# Patient Record
Sex: Male | Born: 2000 | Race: Black or African American | Hispanic: No | Marital: Single | Smoking: Never smoker
Health system: Southern US, Community
[De-identification: ages and names within clinical notes are randomized; demographics above are authoritative.]

---

## 2021-02-13 ENCOUNTER — Other Ambulatory Visit: Payer: Self-pay

## 2021-02-13 ENCOUNTER — Encounter: Payer: Self-pay | Admitting: Nurse Practitioner

## 2021-02-19 ENCOUNTER — Ambulatory Visit (INDEPENDENT_AMBULATORY_CARE_PROVIDER_SITE_OTHER): Payer: Medicaid Other | Admitting: Nurse Practitioner

## 2021-02-19 ENCOUNTER — Other Ambulatory Visit: Payer: Self-pay

## 2021-02-19 ENCOUNTER — Ambulatory Visit (HOSPITAL_COMMUNITY)
Admission: RE | Admit: 2021-02-19 | Discharge: 2021-02-19 | Disposition: A | Discharge status: Home / Self Care | Payer: Medicaid Other | Source: Ambulatory Visit | Attending: Nurse Practitioner | Admitting: Nurse Practitioner

## 2021-02-19 ENCOUNTER — Encounter: Payer: Self-pay | Admitting: Nurse Practitioner

## 2021-02-19 VITALS — BP 129/69 | HR 71 | Temp 97.8°F | Ht 74.0 in | Wt 130.0 lb

## 2021-02-19 DIAGNOSIS — M25562 Pain in left knee: Secondary | ICD-10-CM | POA: Diagnosis not present

## 2021-02-19 DIAGNOSIS — Z23 Encounter for immunization: Secondary | ICD-10-CM | POA: Diagnosis not present

## 2021-02-19 DIAGNOSIS — Z0000 Encounter for general adult medical examination without abnormal findings: Secondary | ICD-10-CM

## 2021-02-19 DIAGNOSIS — G8929 Other chronic pain: Secondary | ICD-10-CM | POA: Insufficient documentation

## 2021-02-19 DIAGNOSIS — M25561 Pain in right knee: Secondary | ICD-10-CM | POA: Diagnosis not present

## 2021-02-19 LAB — POCT URINALYSIS DIP (CLINITEK)
Bilirubin, UA: NEGATIVE
Blood, UA: NEGATIVE
Glucose, UA: NEGATIVE mg/dL
Ketones, POC UA: NEGATIVE mg/dL
Leukocytes, UA: NEGATIVE
Nitrite, UA: NEGATIVE
POC PROTEIN,UA: NEGATIVE
Spec Grav, UA: 1.025 (ref 1.010–1.025)
Urobilinogen, UA: 0.2 E.U./dL
pH, UA: 6 (ref 5.0–8.0)

## 2021-02-19 NOTE — Patient Instructions (Addendum)
You were seen today in the [facility] to establish care. Labs were collected, results will be available via MyChart or, if abnormal, you will be contacted by clinic staff. You were prescribed medications, please take as directed. Please follow up in 3 mths for reevaluation. Referral was sent to orthopedics, please call them to schedule appointment.  Please take OTC tylenol and/ ibuprofen at home as needed for pain.

## 2021-02-19 NOTE — Progress Notes (Signed)
[place], [place]  [id number] Phone:  [phone]   Fax:  [phone] Subjective:   Patient ID: Taylor Woods, male    DOB: [birth year], 20 y.o.   MRN: [id number]  Chief Complaint  Patient presents with   Establish Care    Bilateral leg pain; started 2019 does not take anything for relief.   HPI Taylor Woods 20 y.o. male presents to establish care. Patient a refugee from [place] who has been in the [place] x 1 mth. States that he would like evaluation and treatment of bilateral knee pain. Has had pain since 2019. Denies any trauma or injury. Rates pain 9/10 and describes as throbbing, occurs throughout the day worsening with ambulation. Has not taken any medications for symptoms. Denies any numbness or tingling. Brother at bedside states that he took patient to specialist in [place] and was informed that he would need surgical repair of bilateral knees. Denies any chest pain or shortness of breath. Denies any HA, dizziness or blurred vision. Denies any numbness or tingling. Patient not currently working since arrival in the [place].  No past medical history on file.   No family history on file.  Social History   Socioeconomic History   Marital status: Single    Spouse name: Not on file   Number of children: Not on file   Years of education: Not on file   Highest education level: Not on file  Occupational History   Not on file  Tobacco Use   Smoking status: Never   Smokeless tobacco: Never  Substance and Sexual Activity   Alcohol use: Never   Drug use: Never   Sexual activity: Not Currently  Other Topics Concern   Not on file  Social History Narrative   Not on file   Social Determinants of Health   Financial Resource Strain: Not on file  Food Insecurity: Not on file  Transportation Needs: Not on file  Physical Activity: Not on file  Stress: Not on file  Social Connections: Not on file  Intimate Partner Violence: Not on file     No outpatient medications prior to visit.   No facility-administered medications prior to visit.    No Known Allergies  Review of Systems  Constitutional:  Negative for chills, fever and malaise/fatigue.  Respiratory:  Negative for cough and shortness of breath.   Cardiovascular:  Negative for chest pain, palpitations and leg swelling.  Gastrointestinal:  Negative for abdominal pain, blood in stool, constipation, diarrhea, nausea and vomiting.  Musculoskeletal:  Positive for joint pain.       Pain in bilateral knees  Skin: Negative.   Psychiatric/Behavioral:  Negative for depression. The patient is not nervous/anxious.   All other systems reviewed and are negative.     Objective:    Physical Exam Vitals reviewed.  Constitutional:      General: He is not in acute distress.    Appearance: Normal appearance.  HENT:     Head: Normocephalic.     Mouth/Throat:     Mouth: Mucous membranes are moist.  Eyes:     Extraocular Movements: Extraocular movements intact.     Conjunctiva/sclera: Conjunctivae normal.     Pupils: Pupils are equal, round, and reactive to light.  Cardiovascular:     Rate and Rhythm: Normal rate and regular rhythm.     Pulses: Normal pulses.     Heart sounds: Normal heart sounds.     Comments: No obvious  peripheral edema Pulmonary:     Effort: Pulmonary effort is normal.     Breath sounds: Normal breath sounds.  Abdominal:     General: Abdomen is flat. Bowel sounds are normal.     Palpations: Abdomen is soft.  Musculoskeletal:        General: No swelling, tenderness, deformity or signs of injury. Normal range of motion.     Cervical back: Normal range of motion and neck supple.     Right lower leg: No edema.     Left lower leg: No edema.  Skin:    General: Skin is warm and dry.     Capillary Refill: Capillary refill takes less than 2 seconds.  Neurological:     General: No focal deficit present.     Mental Status: He is alert and oriented to  person, place, and time. Mental status is at baseline.  Psychiatric:        Mood and Affect: Mood normal.        Behavior: Behavior normal.        Thought Content: Thought content normal.        Judgment: Judgment normal.    BP 129/69   Pulse 71   Temp 97.8 F (36.6 C)   Ht _0  (1.88 m)   Wt 130 lb (59 kg)   SpO2 98%   BMI 16.69 kg/m  Wt Readings from Last 3 Encounters:  02/19/21 130 lb (59 kg)    Immunization History  Administered Date(s) Administered   Influenza,inj,Quad PF,6+ Mos 02/19/2021    Diabetic Foot Exam - Simple   No data filed     No results found for: TSH No results found for: WBC, HGB, HCT, MCV, PLT No results found for: NA, K, CHLORIDE, CO2, GLUCOSE, BUN, CREATININE, BILITOT, ALKPHOS, AST, ALT, PROT, ALBUMIN, CALCIUM, ANIONGAP, EGFR, GFR No results found for: CHOL No results found for: HDL No results found for: LDLCALC No results found for: TRIG No results found for: CHOLHDL No results found for: HGBA1C     Assessment & Plan:   Problem List Items Addressed This Visit   None Visit Diagnoses     Healthcare maintenance    -  Primary   Relevant Orders   POCT URINALYSIS DIP (CLINITEK) (Completed)   CBC with Differential/Platelet   Comprehensive metabolic panel   Lipid panel   Flu Vaccine QUAD 67moIM (Fluarix, Fluzone & Alfiuria Quad PF) (Completed)   Hemoglobin A1c   Chronic pain of both knees       Relevant Orders   Ambulatory referral to Orthopedic Surgery   DG Knee Complete 4 Views Left   DG Knee Complete 4 Views Right Patient informed to take OTC tylenol and/ ibuprofen as needed for pain       Taylor Woods does not currently have medications on file.  No orders of the defined types were placed in this encounter.    TTeena Dunk NP

## 2021-02-20 ENCOUNTER — Other Ambulatory Visit: Payer: Self-pay | Admitting: Nurse Practitioner

## 2021-02-20 ENCOUNTER — Telehealth: Payer: Self-pay

## 2021-02-20 DIAGNOSIS — R748 Abnormal levels of other serum enzymes: Secondary | ICD-10-CM

## 2021-02-20 LAB — CBC WITH DIFFERENTIAL/PLATELET
Basophils Absolute: 0.1 10*3/uL (ref 0.0–0.2)
Basos: 1 %
EOS (ABSOLUTE): 0.5 10*3/uL — ABNORMAL HIGH (ref 0.0–0.4)
Eos: 12 %
Hematocrit: 45 % (ref 37.5–51.0)
Hemoglobin: 14.6 g/dL (ref 13.0–17.7)
Immature Grans (Abs): 0 10*3/uL (ref 0.0–0.1)
Immature Granulocytes: 0 %
Lymphocytes Absolute: 2.4 10*3/uL (ref 0.7–3.1)
Lymphs: 50 %
MCH: 26.6 pg (ref 26.6–33.0)
MCHC: 32.4 g/dL (ref 31.5–35.7)
MCV: 82 fL (ref 79–97)
Monocytes Absolute: 0.5 10*3/uL (ref 0.1–0.9)
Monocytes: 10 %
Neutrophils Absolute: 1.2 10*3/uL — ABNORMAL LOW (ref 1.4–7.0)
Neutrophils: 27 %
Platelets: 243 10*3/uL (ref 150–450)
RBC: 5.49 x10E6/uL (ref 4.14–5.80)
RDW: 13.9 % (ref 11.6–15.4)
WBC: 4.6 10*3/uL (ref 3.4–10.8)

## 2021-02-20 LAB — COMPREHENSIVE METABOLIC PANEL
ALT: 25 IU/L (ref 0–44)
AST: 25 IU/L (ref 0–40)
Albumin/Globulin Ratio: 1.8 (ref 1.2–2.2)
Albumin: 4.6 g/dL (ref 4.1–5.2)
Alkaline Phosphatase: 402 IU/L — ABNORMAL HIGH (ref 51–125)
BUN/Creatinine Ratio: 13 (ref 9–20)
BUN: 8 mg/dL (ref 6–20)
Bilirubin Total: 0.2 mg/dL (ref 0.0–1.2)
CO2: 26 mmol/L (ref 20–29)
Calcium: 9.8 mg/dL (ref 8.7–10.2)
Chloride: 101 mmol/L (ref 96–106)
Creatinine, Ser: 0.6 mg/dL — ABNORMAL LOW (ref 0.76–1.27)
Globulin, Total: 2.6 g/dL (ref 1.5–4.5)
Glucose: 95 mg/dL (ref 65–99)
Potassium: 4.9 mmol/L (ref 3.5–5.2)
Sodium: 139 mmol/L (ref 134–144)
Total Protein: 7.2 g/dL (ref 6.0–8.5)
eGFR: 142 mL/min/{1.73_m2} (ref >59)

## 2021-02-20 LAB — LIPID PANEL
Chol/HDL Ratio: 2.1 ratio (ref 0.0–5.0)
Cholesterol, Total: 139 mg/dL (ref 100–199)
HDL: 66 mg/dL (ref >39)
LDL Chol Calc (NIH): 62 mg/dL (ref 0–99)
Triglycerides: 50 mg/dL (ref 0–149)
VLDL Cholesterol Cal: 11 mg/dL (ref 5–40)

## 2021-02-20 LAB — HEMOGLOBIN A1C
Est. average glucose Bld gHb Est-mCnc: 117 mg/dL
Hgb A1c MFr Bld: 5.7 % — ABNORMAL HIGH (ref 4.8–5.6)

## 2021-02-20 NOTE — Telephone Encounter (Signed)
Left voicemail to call back for lab appointment.  

## 2021-03-01 ENCOUNTER — Ambulatory Visit (INDEPENDENT_AMBULATORY_CARE_PROVIDER_SITE_OTHER): Payer: Medicaid Other | Admitting: Orthopedic Surgery

## 2021-03-01 ENCOUNTER — Other Ambulatory Visit: Payer: Self-pay

## 2021-03-01 DIAGNOSIS — M25562 Pain in left knee: Secondary | ICD-10-CM | POA: Diagnosis not present

## 2021-03-01 NOTE — Progress Notes (Signed)
Office Visit Note   Patient: Taylor Woods           Date of Birth: [birth year]           MRN: [id number] Visit Date: 03/01/2021              Requested by: [name], NP [address] [place],  [place] [address] PCP: Kathrynn Speed, NP  Chief Complaint  Patient presents with   Left Knee - Pain   Right Knee - Pain      HPI: Patient is a 20 year old gentleman who is seen for initial evaluation with his father.  Patient's father notes that the patient is having progressive valgus deformity.  Patient's father states that in [place] it was recommended to perform a bone type procedure for both knees.  Assessment & Plan: Visit Diagnoses:  1. Left knee pain, unspecified chronicity     Plan: Patient's ligaments are stable there is no abnormality to the growth plate.  Will start patient with physical therapy for VMO quad strengthening.  We will obtain a vitamin D3 level CBC and a complete metabolic profile.  Patient's growth plates are essentially closed.  Follow-Up Instructions: Return in about 2 months (around 05/01/2021).   Ortho Exam  Patient is alert, oriented, no adenopathy, well-dressed, normal affect, normal respiratory effort. On examination patient has slight valgus to the right knee greater than the left.  Patient's father also has valgus deformity to both knees.  Patient has extremely atrophic muscles and both the thigh and calf bilaterally.  Patient's patella tracks midline.  Collateral and cruciate ligaments are stable no ligamentous instability.  Review of his radiographs shows congruent growth plates with congruent joint space without any growth plate abnormality.  The growth plates are almost completely closed.    Of note his hemoglobin A1c about 10 days ago was 5.7.  Imaging: No results found. No images are attached to the encounter.  Labs: Lab Results  Component Value Date   HGBA1C 5.7 (H) 02/19/2021     Lab Results  Component Value Date   ALBUMIN  4.6 02/19/2021    No results found for: MG No results found for: VD25OH  No results found for: PREALBUMIN CBC EXTENDED Latest Ref Rng & Units 02/19/2021  WBC 3.4 - 10.8 x10E3/uL 4.6  RBC 4.14 - 5.80 x10E6/uL 5.49  HGB 13.0 - 17.7 g/dL 54.6  HCT 27.0 - 35.0 % 45.0  PLT 150 - 450 x10E3/uL 243  NEUTROABS 1.4 - 7.0 x10E3/uL 1.2(L)  LYMPHSABS 0.7 - 3.1 x10E3/uL 2.4     There is no height or weight on file to calculate BMI.  Orders:  Orders Placed This Encounter  Procedures   Vitamin D (25 hydroxy)   CBC with Differential   Comprehensive Metabolic Panel (CMET)   Ambulatory referral to Physical Therapy   No orders of the defined types were placed in this encounter.    Procedures: No procedures performed  Clinical Data: No additional findings.  ROS:  All other systems negative, except as noted in the HPI. Review of Systems  Objective: Vital Signs: There were no vitals taken for this visit.  Specialty Comments:  No specialty comments available.  PMFS History: There are no problems to display for this patient.  No past medical history on file.  No family history on file.   Social History   Occupational History   Not on file  Tobacco Use   Smoking status: Never   Smokeless tobacco:  Never  Substance and Sexual Activity   Alcohol use: Never   Drug use: Never   Sexual activity: Not Currently

## 2021-03-02 LAB — CBC WITH DIFFERENTIAL/PLATELET
Absolute Monocytes: 390 cells/uL (ref 200–950)
Basophils Absolute: 39 cells/uL (ref 0–200)
Basophils Relative: 1 %
Eosinophils Absolute: 300 cells/uL (ref 15–500)
Eosinophils Relative: 7.7 %
HCT: 45.4 % (ref 38.5–50.0)
Hemoglobin: 14.8 g/dL (ref 13.2–17.1)
Lymphs Abs: 2005 cells/uL (ref 850–3900)
MCH: 26.8 pg — ABNORMAL LOW (ref 27.0–33.0)
MCHC: 32.6 g/dL (ref 32.0–36.0)
MCV: 82.1 fL (ref 80.0–100.0)
MPV: 10.4 fL (ref 7.5–12.5)
Monocytes Relative: 10 %
Neutro Abs: 1166 cells/uL — ABNORMAL LOW (ref 1500–7800)
Neutrophils Relative %: 29.9 %
Platelets: 238 10*3/uL (ref 140–400)
RBC: 5.53 10*6/uL (ref 4.20–5.80)
RDW: 13.7 % (ref 11.0–15.0)
Total Lymphocyte: 51.4 %
WBC: 3.9 10*3/uL (ref 3.8–10.8)

## 2021-03-02 LAB — COMPREHENSIVE METABOLIC PANEL
AG Ratio: 1.8 (calc) (ref 1.0–2.5)
ALT: 22 U/L (ref 9–46)
AST: 19 U/L (ref 10–40)
Albumin: 4.6 g/dL (ref 3.6–5.1)
Alkaline phosphatase (APISO): 359 U/L — ABNORMAL HIGH (ref 36–130)
BUN/Creatinine Ratio: 15 (calc) (ref 6–22)
BUN: 8 mg/dL (ref 7–25)
CO2: 28 mmol/L (ref 20–32)
Calcium: 9.8 mg/dL (ref 8.6–10.3)
Chloride: 102 mmol/L (ref 98–110)
Creat: 0.55 mg/dL — ABNORMAL LOW (ref 0.60–1.24)
Globulin: 2.6 g/dL (calc) (ref 1.9–3.7)
Glucose, Bld: 108 mg/dL — ABNORMAL HIGH (ref 65–99)
Potassium: 4.7 mmol/L (ref 3.5–5.3)
Sodium: 139 mmol/L (ref 135–146)
Total Bilirubin: 0.4 mg/dL (ref 0.2–1.2)
Total Protein: 7.2 g/dL (ref 6.1–8.1)

## 2021-03-02 LAB — VITAMIN D 25 HYDROXY (VIT D DEFICIENCY, FRACTURES): Vit D, 25-Hydroxy: 24 ng/mL — ABNORMAL LOW (ref 30–100)

## 2021-04-30 ENCOUNTER — Encounter: Payer: Self-pay | Admitting: Orthopedic Surgery

## 2021-04-30 ENCOUNTER — Ambulatory Visit (INDEPENDENT_AMBULATORY_CARE_PROVIDER_SITE_OTHER): Payer: Medicaid Other | Admitting: Orthopedic Surgery

## 2021-04-30 DIAGNOSIS — M898X9 Other specified disorders of bone, unspecified site: Secondary | ICD-10-CM | POA: Diagnosis not present

## 2021-04-30 DIAGNOSIS — M25561 Pain in right knee: Secondary | ICD-10-CM

## 2021-04-30 NOTE — Progress Notes (Signed)
Office Visit Note   Patient: Taylor Woods           Date of Birth: 12-Jun-2000           MRN: [id number] Visit Date: 04/30/2021              Requested by: [name], NP [address], [place],  [place] [address] PCP: [name], NP  Chief Complaint  Patient presents with   Left Knee - Pain, Follow-up      HPI: Patient is a 20 year old gentleman who presents in follow-up with knee pain at this time primarily in the right knee over the proximal tibia metaphysis.  Family accompanying patient states that therapy did not set up therapy for VMO strengthening.  Assessment & Plan: Visit Diagnoses:  1. Acute pain of right knee   2. Bone disease, metabolic     Plan: Will place a new order for physical therapy for VMO strengthening both lower extremities.  I reviewed his laboratory values and recommended vitamin D3 supplement 5000 international units a day and vitamin C 2000 mg a day.  Follow-Up Instructions: Return in about 2 months (around 06/30/2021).   Ortho Exam  Patient is alert, oriented, no adenopathy, well-dressed, normal affect, normal respiratory effort. Examination patient has an antalgic gait with valgus alignment to both knees.  Examination the right knee his primarily tender to palpation over the metaphysis of the right knee.  The medial and lateral joint lines are nontender to palpation collaterals are cruciates are stable.  In review of the right knee there is no metaphyseal defects in the bone no sclerotic changes the growth plates are open.  Patient's alkaline phosphatase is 359 consistent with a underlying bone disease.  His vitamin D level is low at 24.  He has mild microcytic anemia with a MCV of 26.8.  Imaging: No results found. No images are attached to the encounter.  Labs: Lab Results  Component Value Date   HGBA1C 5.7 (H) 02/19/2021     Lab Results  Component Value Date   ALBUMIN 4.6 02/19/2021    No results found for: MG Lab  Results  Component Value Date   VD25OH 24 (L) 03/01/2021    No results found for: PREALBUMIN CBC EXTENDED Latest Ref Rng & Units 03/01/2021 02/19/2021  WBC 3.8 - 10.8 Thousand/uL 3.9 4.6  RBC 4.20 - 5.80 Million/uL 5.53 5.49  HGB 13.2 - 17.1 g/dL 40.8 14.4  HCT 81.8 - 56.3 % 45.4 45.0  PLT 140 - 400 Thousand/uL 238 243  NEUTROABS 1,500 - 7,800 cells/uL 1,166(L) 1.2(L)  LYMPHSABS 850 - 3,900 cells/uL 2,005 2.4     There is no height or weight on file to calculate BMI.  Orders:  No orders of the defined types were placed in this encounter.  No orders of the defined types were placed in this encounter.    Procedures: No procedures performed  Clinical Data: No additional findings.  ROS:  All other systems negative, except as noted in the HPI. Review of Systems  Objective: Vital Signs: There were no vitals taken for this visit.  Specialty Comments:  No specialty comments available.  PMFS History: There are no problems to display for this patient.  History reviewed. No pertinent past medical history.  History reviewed. No pertinent family history.  History reviewed. No pertinent surgical history. Social History   Occupational History   Not on file  Tobacco Use   Smoking status: Never   Smokeless  tobacco: Never  Substance and Sexual Activity   Alcohol use: Never   Drug use: Never   Sexual activity: Not Currently

## 2021-05-19 ENCOUNTER — Other Ambulatory Visit: Payer: Self-pay

## 2021-05-19 ENCOUNTER — Ambulatory Visit: Payer: Medicaid Other | Attending: Orthopedic Surgery | Admitting: Physical Therapy

## 2021-05-19 ENCOUNTER — Encounter: Payer: Self-pay | Admitting: Physical Therapy

## 2021-05-19 DIAGNOSIS — M6281 Muscle weakness (generalized): Secondary | ICD-10-CM | POA: Insufficient documentation

## 2021-05-19 DIAGNOSIS — R2689 Other abnormalities of gait and mobility: Secondary | ICD-10-CM | POA: Diagnosis present

## 2021-05-19 DIAGNOSIS — M898X9 Other specified disorders of bone, unspecified site: Secondary | ICD-10-CM | POA: Diagnosis not present

## 2021-05-19 DIAGNOSIS — M25562 Pain in left knee: Secondary | ICD-10-CM | POA: Diagnosis not present

## 2021-05-19 DIAGNOSIS — G8929 Other chronic pain: Secondary | ICD-10-CM | POA: Diagnosis present

## 2021-05-19 DIAGNOSIS — M25561 Pain in right knee: Secondary | ICD-10-CM | POA: Insufficient documentation

## 2021-05-19 NOTE — Therapy (Signed)
OUTPATIENT PHYSICAL THERAPY LOWER EXTREMITY EVALUATION   Patient Name: Taylor Woods MRN: [id number] DOB:January 18, 2001, 20 y.o., male Today's Date: 05/19/2021   PT End of Session - 05/19/21 1213     Visit Number 1    Date for PT Re-Evaluation 07/14/21    Authorization Type [employer]    PT Start Time 1115    PT Stop Time 1200    PT Time Calculation (min) 45 min             History reviewed. No pertinent past medical history. History reviewed. No pertinent surgical history. There are no problems to display for this patient.   PCP: Kathrynn Speed, NP  REFERRING PROVIDER: Nadara Mustard, MD  REFERRING DIAG: Referral diagnosis: Acute pain of right knee [M25.561], Bone disease, metabolic [M89.8X9]  THERAPY DIAG:  Chronic pain of left knee  Chronic pain of right knee  Muscle weakness  Other abnormalities of gait and mobility  ONSET DATE: 2019  SUBJECTIVE:                                                                                                                                                                                           Taylor Woods is a 20 y.o. male who presents to clinic with chief complaint of bil knee pain.  MOI/History of condition:  About 3 years.  Insidious onset, he is unsure why.  MD reports there may be some underlying bone disease.  From referring provider:   "Patient is a 20 year old gentleman who presents in follow-up with knee pain at this time primarily in the right knee over the proximal tibia metaphysis.  Family accompanying patient states that therapy did not set up therapy for VMO strengthening.  Patient is alert, oriented, no adenopathy, well-dressed, normal affect, normal respiratory effort.  Examination patient has an antalgic gait with valgus alignment to both knees.  Examination the right knee his primarily tender to palpation over the metaphysis of the right knee.  The medial and lateral joint lines are nontender to palpation  collaterals are cruciates are stable.  In review of the right knee there is no metaphyseal defects in the bone no sclerotic changes the growth plates are open.  Patient's alkaline phosphatase is 359 consistent with a underlying bone disease.  His vitamin D level is low at 24.  He has mild microcytic anemia with a MCV of 26.8."   Red flags:  denies   Pertinent past history:  none  Pain:  Are you having pain? 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 No, Number of falls: 0  LIVING ENVIRONMENT: Lives with: lives with their family Stairs: No  PLOF: Independent  DIAGNOSTIC FINDINGS:   FINDINGS: Right knee:   The patient is skeletally immature. There is no definite acute fracture or dislocation. Joint spaces and growth plates appear well maintained. Soft tissues are within normal limits. Punctate radiopaque density is favored is artifact at the level of the distal femur.   Left knee:   The patient is skeletally immature. There is no definite acute fracture or dislocation. Joint spaces and growth plates appear well maintained. Soft tissues are within normal limits. Punctate radiopaque density is favored is artifact at the level of the distal femur.   OBJECTIVE:    GENERAL OBSERVATION:   Some mild edema anterior knee R>L, hypermobile patella R>L, mod high arch with no collapse  SENSATION:  Light touch: Appears intact  MUSCLE LENGTH: Hamstrings: WNL Thomas  test: WNL Ely's test: WNL  LE ROM:  ROM Right 05/19/2021 Left 05/19/2021  Hip flexion WNL WNL  Hip extension WNL WNL  Hip abduction WNL WNL  Hip adduction    Hip internal rotation WNL WNL  Hip external rotation WNL WNL  Knee flexion WNL WNL  Knee extension WNL WNL  Ankle dorsiflexion    Ankle plantarflexion    Ankle inversion    Ankle eversion     (Blank rows = not tested)  LE MMT:  MMT Right 05/19/2021 Left 05/19/2021  Hip flexion 4 4  Knee extension 4 P! 4 P!  Knee flexion 3 P! 4 P!  Hip abduction 4 3+  Hip extension 3+ 3+  Hip external rotation    Hip internal rotation 3+ P! 3+ P!  Hip adduction    Ankle dorsiflexion    Ankle plantarflexion    Ankle inversion    Ankle eversion     (Blank rows = not tested; all scores listed out of a possible 5)   LOWER EXTREMITY SPECIAL TESTS:  Knee special tests: ligamentous testing (-)  PALPATION: Tenderness over pes anserine bil  FUNCTIONAL TESTS:  NA  GAIT: Comments: significant bilateral valgus R>L, with R>L internal rotation   PATIENT EDUCATION:  POC, diagnosis, prognosis, HEP, and outcome measures.  Pt educated via explanation, demonstration, and handout (HEP).  Pt confirms understanding verbally.    HOME EXERCISE PROGRAM: Access Code: [email or web address] URL: [email or web address] Date: 05/19/2021 Prepared by: Alphonzo Severance  Exercises Supine Bridge - 1 x daily - 7 x weekly - 3 sets - 10 reps Clamshell with Resistance - 1 x daily - 7 x weekly - 3 sets - 10 reps Seated Hamstring Curl with Anchored Resistance - 1 x daily - 7 x weekly - 3 sets - 10 reps   ASSESSMENT:  CLINICAL IMPRESSION: Taylor Woods is a 20 y.o. male who presents to clinic with signs and sxs consistent with bil knee pain.  He has pain over bil pes anserine with palpation and pain with resisted hip flexion + IR as well as HS testing.  This may be tendinitis/bursitis, or could be related to an underlying bone pathology per MD.  He does  have  significant valgus deformity bil R>L with hip and knee weakness that should be addressed.  Arches are not collapsed.  We did talk about accomodation at work as he is in a tough situation with having to be on his feet all day.  We discussed that he would have to go through Dr. Lajoyce Corners to be put on light duty; they confirm understanding.  Patient presents with pain and impairments/deficits in: knee and hip strength.  Activity limitations include: walking, standing, squatting.  Participation limitations include: working without pain.  Patient will benefit from skilled therapy to address pain and the listed deficits in order to achieve functional goals, enable safety and independence in completion of daily tasks, and return to PLOF.   REHAB POTENTIAL: Good  CLINICAL DECISION MAKING: Stable/uncomplicated  EVALUATION COMPLEXITY: Low   GOALS: Goals reviewed with patient? Yes  SHORT TERM GOALS:  STG Name Target Date Goal status  1 [place] will be >75% HEP compliant to improve carryover between sessions and facilitate independent management of condition  Baseline: No HEP 06/09/2021 INITIAL   LONG TERM GOALS:   LTG Name Target Date Goal status  1 Mieczyslaw will report >/= 50% decrease in pain from evaluation   Baseline: 9/10 max pain 07/14/2021 INITIAL  2 Chevy will improve the following MMTs to >/= 4/5 to show improvement in strength:  knee and hip abduction    Baseline: see chart in note 07/14/2021 INITIAL  3 [place]ally will be able to complete work, not limited by pain  Baseline: pain as high as 9/10 07/14/2021 INITIAL   PLAN: PT FREQUENCY: 1-2x/week  PT DURATION: 8 weeks (Ending 07/14/2021)  PLANNED INTERVENTIONS: Therapeutic exercises, Therapeutic activity, Neuro Muscular re-education, Gait training, Patient/Family education, Joint mobilization, Dry Needling, Electrical stimulation, Spinal mobilization and/or manipulation, Moist heat, Taping, Vasopneumatic device, Ionotophoresis 4mg /ml Dexamethasone,  and Manual therapy  PLAN FOR NEXT SESSION: progress knee and hip strength, see if gradual loading of pes anserine is effective, activity modification at work    PT, DPT 05/19/2021, 12:14 PM

## 2021-05-21 ENCOUNTER — Ambulatory Visit: Payer: Self-pay | Admitting: Nurse Practitioner

## 2021-05-21 NOTE — Progress Notes (Signed)
This encounter was created in error - please disregard.

## 2021-06-09 ENCOUNTER — Other Ambulatory Visit: Payer: Self-pay

## 2021-06-09 ENCOUNTER — Ambulatory Visit: Payer: Medicaid Other | Attending: Orthopedic Surgery | Admitting: Physical Therapy

## 2021-06-09 DIAGNOSIS — M25561 Pain in right knee: Secondary | ICD-10-CM | POA: Insufficient documentation

## 2021-06-09 DIAGNOSIS — G8929 Other chronic pain: Secondary | ICD-10-CM | POA: Diagnosis present

## 2021-06-09 DIAGNOSIS — R2689 Other abnormalities of gait and mobility: Secondary | ICD-10-CM | POA: Insufficient documentation

## 2021-06-09 DIAGNOSIS — M25562 Pain in left knee: Secondary | ICD-10-CM | POA: Insufficient documentation

## 2021-06-09 DIAGNOSIS — M6281 Muscle weakness (generalized): Secondary | ICD-10-CM | POA: Diagnosis present

## 2021-06-09 NOTE — Therapy (Signed)
OUTPATIENT PHYSICAL THERAPY TREATMENT NOTE   Patient Name: Taylor Woods MRN: [id number] DOB:2000/07/10, 21 y.o., male 77 Date: 06/09/2021  PCP: Kathrynn Speed, NP REFERRING PROVIDER: Nadara Mustard, MD   PT End of Session - 06/09/21 1115     Visit Number 2    Date for PT Re-Evaluation 07/14/21    Authorization Type [employer]    PT Start Time 1115    PT Stop Time 1157    PT Time Calculation (min) 42 min             No past medical history on file. No past surgical history on file. There are no problems to display for this patient.   REFERRING DIAG: knee pain  THERAPY DIAG:  Chronic pain of left knee  Chronic pain of right knee  Muscle weakness  Other abnormalities of gait and mobility  PERTINENT HISTORY: none  PRECAUTIONS/RESTRICTIONS:   none  SUBJECTIVE:  Pt reports there has been little change in his knee pain since last visit  Are you having pain? Yes Pain location: pes anserine area bil NPRS scale: 8-9/10 Aggravating factors: walking (hours), jumping, bending Relieving factors: rest, ice Pain description: sharp and aching Severity: high Irritability: moderate Stage: Chronic Stability: staying the same 24 hour pattern: no clear patter   OBJECTIVE:  LE ROM:   ROM Right 05/19/2021 Left 05/19/2021  Hip flexion WNL WNL  Hip extension WNL WNL  Hip abduction WNL WNL  Hip adduction      Hip internal rotation WNL WNL  Hip external rotation WNL WNL  Knee flexion WNL WNL  Knee extension WNL WNL  Ankle dorsiflexion      Ankle plantarflexion      Ankle inversion      Ankle eversion       (Blank rows = not tested)   LE MMT:   MMT Right 05/19/2021 Left 05/19/2021  Hip flexion 4 4  Knee extension 4 P! 4 P!  Knee flexion 3 P! 4 P!  Hip abduction 4 3+  Hip extension 3+ 3+  Hip external rotation      Hip internal rotation 3+ P! 3+ P!  Hip adduction      Ankle dorsiflexion      Ankle plantarflexion      Ankle inversion       Ankle eversion       (Blank rows = not tested; all scores listed out of a possible 5)   TREATMENT 06/09/2021:  Therapeutic Exercise: - bike for warm up - quad set with medial patellar glide glide 2x20 ea - S/L bridge - 3x10 ea - S/L hip abd - 3x10 ea (heavy cues) - knee ext machine - 3x10 @ 20 (S/L - leg press d/c d/t pain - side plank with clam - 3x10 ea with RTB   Patient Education: - HEP was updated and reissued to patient; pt educated on HEP, was provided handout, and verbally confirmed understanding of exercises.   HOME EXERCISE PROGRAM: Access Code: [email or web address] URL: [email or web address] Date: 06/09/2021 Prepared by: Alphonzo Severance  Exercises Seated Hamstring Curl with Anchored Resistance - 1 x daily - 7 x weekly - 3 sets - 10 reps Seated Knee Extension with Resistance - 1 x daily - 7 x weekly - 3 sets - 10 reps Single Leg Bridge - 1 x daily - 7 x weekly - 3 sets - 10 reps Side Plank with Clam and Resistance - 1 x daily - 7 x weekly - 3 sets -  10 reps    ASSESSMENT:   CLINICAL IMPRESSION: [name] is progressing well with therapy.  Pt reports no increase in baseline pain following therapy.  Today we concentrated on quad strengthening and hip strengthening.  Pt with R>L hip abd strength deficit likely contributing to valgus.  We were able to do a little more direct quad strengthening today compared to eval.  HEP updated.  Pt will continue to benefit from skilled physical therapy to address remaining deficits and achieve listed goals.  Continue per POC.     REHAB POTENTIAL: Good   CLINICAL DECISION MAKING: Stable/uncomplicated   EVALUATION COMPLEXITY: Low     GOALS: Goals reviewed with patient? Yes   SHORT TERM GOALS:   STG Name Target Date Goal status  1 [name] will be >75% HEP compliant to improve carryover between sessions and facilitate independent management of condition   Baseline: No HEP 06/09/2021 INITIAL    LONG TERM GOALS:    LTG Name  Target Date Goal status  1 [name] will report >/= 50% decrease in pain from evaluation    Baseline: 9/10 max pain 07/14/2021 INITIAL  2 [name] will improve the following MMTs to >/= 4/5 to show improvement in strength:  knee and hip abduction     Baseline: see chart in note 07/14/2021 INITIAL  3 [name] will be able to complete work, not limited by pain   Baseline: pain as high as 9/10 07/14/2021 INITIAL    PLAN: PT FREQUENCY: 1-2x/week   PT DURATION: 8 weeks (Ending 07/14/2021)   PLANNED INTERVENTIONS: Therapeutic exercises, Therapeutic activity, Neuro Muscular re-education, Gait training, Patient/Family education, Joint mobilization, Dry Needling, Electrical stimulation, Spinal mobilization and/or manipulation, Moist heat, Taping, Vasopneumatic device, Ionotophoresis 4mg /ml Dexamethasone, and Manual therapy   PLAN FOR NEXT SESSION: progress knee and hip strength, see if gradual loading of pes anserine is effective, activity modification at work    06/09/2021, 11:16 AM

## 2021-06-16 ENCOUNTER — Ambulatory Visit: Payer: Medicaid Other | Admitting: Physical Therapy

## 2021-06-16 ENCOUNTER — Other Ambulatory Visit: Payer: Self-pay

## 2021-06-16 ENCOUNTER — Encounter: Payer: Self-pay | Admitting: Physical Therapy

## 2021-06-16 DIAGNOSIS — M25562 Pain in left knee: Secondary | ICD-10-CM | POA: Diagnosis not present

## 2021-06-16 DIAGNOSIS — G8929 Other chronic pain: Secondary | ICD-10-CM

## 2021-06-16 DIAGNOSIS — M25561 Pain in right knee: Secondary | ICD-10-CM

## 2021-06-16 DIAGNOSIS — M6281 Muscle weakness (generalized): Secondary | ICD-10-CM

## 2021-06-16 NOTE — Therapy (Signed)
OUTPATIENT PHYSICAL THERAPY TREATMENT NOTE   Patient Name: Taylor Woods MRN: [id number] DOB:05/19/2001, 21 y.o., male 75 Date: 06/16/2021  PCP: Kathrynn Speed, NP REFERRING PROVIDER: Nadara Mustard, MD   PT End of Session - 06/16/21 1040     Visit Number 3    Date for PT Re-Evaluation 07/14/21    Authorization Type [employer]    PT Start Time 1040    PT Stop Time 1120    PT Time Calculation (min) 40 min             History reviewed. No pertinent past medical history. History reviewed. No pertinent surgical history. There are no problems to display for this patient.   REFERRING DIAG: knee pain  THERAPY DIAG:  Chronic pain of left knee  Chronic pain of right knee  Muscle weakness  PERTINENT HISTORY: none  PRECAUTIONS/RESTRICTIONS:   none  SUBJECTIVE:  Pt reports that his knee pain is improving.  His pain is better on the L but continues at a very high level on the R when he is walking.  Are you having pain? Yes Pain location: pes anserine area bil (R>L) NPRS scale: 8/10 Aggravating factors: walking (hours), jumping, bending Relieving factors: rest, ice Pain description: sharp and aching Severity: high Irritability: moderate Stage: Chronic Stability: staying the same 24 hour pattern: no clear patter   OBJECTIVE:  LE ROM:   ROM Right 05/19/2021 Left 05/19/2021  Hip flexion WNL WNL  Hip extension WNL WNL  Hip abduction WNL WNL  Hip adduction      Hip internal rotation WNL WNL  Hip external rotation WNL WNL  Knee flexion WNL WNL  Knee extension WNL WNL  Ankle dorsiflexion      Ankle plantarflexion      Ankle inversion      Ankle eversion       (Blank rows = not tested)   LE MMT:   MMT Right 05/19/2021 Left 05/19/2021  Hip flexion 4 4  Knee extension 4 P! 4 P!  Knee flexion 3 P! 4 P!  Hip abduction 4 3+  Hip extension 3+ 3+  Hip external rotation      Hip internal rotation 3+ P! 3+ P!  Hip adduction      Ankle dorsiflexion       Ankle plantarflexion      Ankle inversion      Ankle eversion       (Blank rows = not tested; all scores listed out of a possible 5)  TREATMENT 06/16/2021:  Therapeutic Exercise: - bike for warm up (L5) - S/L bridge - 3x10 ea with contralateral SKTC - S/L hip abd - 3x10 ea (heavy cues) - knee ext machine - 3x10 @ 20 (S/L - leg press d/c d/t pain - side plank with clam - 3x10 ea with RTB - quad fire hydrant  TREATMENT 06/09/2021:  Therapeutic Exercise: - bike for warm up - quad set with medial patellar glide glide 2x20 ea - S/L bridge - 3x10 ea - S/L hip abd - 3x10 ea (heavy cues) - knee ext machine - 3x10 @ 20 (S/L) - leg press d/c d/t pain - side plank with clam - 3x10 ea with GTB   (We need to work on progressive CC strength moving forward)   HOME EXERCISE PROGRAM: Access Code: [email or web address] URL: [email or web address] Date: 06/09/2021 Prepared by: Alphonzo Severance  Exercises Seated Hamstring Curl with Anchored Resistance - 1 x daily - 7 x weekly - 3 sets -  10 reps Seated Knee Extension with Resistance - 1 x daily - 7 x weekly - 3 sets - 10 reps Single Leg Bridge - 1 x daily - 7 x weekly - 3 sets - 10 reps Side Plank with Clam and Resistance - 1 x daily - 7 x weekly - 3 sets - 10 reps    ASSESSMENT:   CLINICAL IMPRESSION: Taylor Woods is progressing well with therapy.  Pt reports no increase in baseline pain following therapy.  Today we concentrated on core strengthening, quad strengthening, and hip strengthening.  Pt continues to show deficits in quad/hip/core strength; these are improving.  R>L hip abduction weakness.  Heavy cues (verbal and tactile) throughout  Pt will continue to benefit from skilled physical therapy to address remaining deficits and achieve listed goals.  Continue per POC.     REHAB POTENTIAL: Good   CLINICAL DECISION MAKING: Stable/uncomplicated   EVALUATION COMPLEXITY: Low     GOALS: Goals reviewed with patient? Yes   SHORT TERM  GOALS:   STG Name Target Date Goal status  1 Taylor Woods will be >75% HEP compliant to improve carryover between sessions and facilitate independent management of condition   Baseline: No HEP 06/09/2021 INITIAL    LONG TERM GOALS:    LTG Name Target Date Goal status  1 Taylor Woods will report >/= 50% decrease in pain from evaluation    Baseline: 9/10 max pain 07/14/2021 INITIAL  2 Taylor Woods will improve the following MMTs to >/= 4/5 to show improvement in strength:  knee and hip abduction     Baseline: see chart in note 07/14/2021 INITIAL  3 Taylor Woods will be able to complete work, not limited by pain   Baseline: pain as high as 9/10 07/14/2021 INITIAL    PLAN: PT FREQUENCY: 1-2x/week   PT DURATION: 8 weeks (Ending 07/14/2021)   PLANNED INTERVENTIONS: Therapeutic exercises, Therapeutic activity, Neuro Muscular re-education, Gait training, Patient/Family education, Joint mobilization, Dry Needling, Electrical stimulation, Spinal mobilization and/or manipulation, Moist heat, Taping, Vasopneumatic device, Ionotophoresis 4mg /ml Dexamethasone, and Manual therapy   PLAN FOR NEXT SESSION: progress knee and hip strength, see if gradual loading of pes anserine is effective, activity modification at work    06/16/2021, 10:53 AM

## 2021-06-23 ENCOUNTER — Ambulatory Visit: Payer: Medicaid Other | Admitting: Rehabilitative and Restorative Service Providers"

## 2021-06-23 ENCOUNTER — Telehealth: Payer: Self-pay | Admitting: Rehabilitative and Restorative Service Providers"

## 2021-06-23 NOTE — Telephone Encounter (Signed)
Front office attempted to phone patient regarding today's no show; however, no interpreter present that spoke [place].

## 2021-06-23 NOTE — Therapy (Incomplete)
OUTPATIENT PHYSICAL THERAPY TREATMENT NOTE   Patient Name: Taylor Woods MRN: [id number] DOB:[birth year], 21 y.o., male 79 Date: 06/23/2021  PCP: Kathrynn Speed, NP REFERRING PROVIDER: Nadara Mustard, MD     No past medical history on file. No past surgical history on file. There are no problems to display for this patient.   REFERRING DIAG: knee pain  THERAPY DIAG:  No diagnosis found.  PERTINENT HISTORY: none  PRECAUTIONS/RESTRICTIONS:   none  SUBJECTIVE:  Pt reports that his knee pain is improving.  His pain is better on the L but continues at a very high level on the R when he is walking.  Are you having pain? Yes Pain location: pes anserine area bil (R>L) NPRS scale: 8/10 Aggravating factors: walking (hours), jumping, bending Relieving factors: rest, ice Pain description: sharp and aching Severity: high Irritability: moderate Stage: Chronic Stability: staying the same 24 hour pattern: no clear patter   OBJECTIVE:  LE ROM:   ROM Right 05/19/2021 Left 05/19/2021  Hip flexion WNL WNL  Hip extension WNL WNL  Hip abduction WNL WNL  Hip adduction      Hip internal rotation WNL WNL  Hip external rotation WNL WNL  Knee flexion WNL WNL  Knee extension WNL WNL  Ankle dorsiflexion      Ankle plantarflexion      Ankle inversion      Ankle eversion       (Blank rows = not tested)   LE MMT:   MMT Right 05/19/2021 Left 05/19/2021  Hip flexion 4 4  Knee extension 4 P! 4 P!  Knee flexion 3 P! 4 P!  Hip abduction 4 3+  Hip extension 3+ 3+  Hip external rotation      Hip internal rotation 3+ P! 3+ P!  Hip adduction      Ankle dorsiflexion      Ankle plantarflexion      Ankle inversion      Ankle eversion       (Blank rows = not tested; all scores listed out of a possible 5)  TREATMENT 06/23/21: Therapeutic Exercise:   TREATMENT 06/16/2021:  Therapeutic Exercise: - bike for warm up (L5) - S/L bridge - 3x10 ea with contralateral SKTC -  S/L hip abd - 3x10 ea (heavy cues) - knee ext machine - 3x10 @ 20 (S/L - leg press d/c d/t pain - side plank with clam - 3x10 ea with RTB - quad fire hydrant  TREATMENT 06/09/2021:  Therapeutic Exercise: - bike for warm up - quad set with medial patellar glide glide 2x20 ea - S/L bridge - 3x10 ea - S/L hip abd - 3x10 ea (heavy cues) - knee ext machine - 3x10 @ 20 (S/L) - leg press d/c d/t pain - side plank with clam - 3x10 ea with GTB   (We need to work on progressive CC strength moving forward)   HOME EXERCISE PROGRAM: Access Code: [email or web address] URL: [email or web address] Date: 06/09/2021 Prepared by: Alphonzo Severance  Exercises Seated Hamstring Curl with Anchored Resistance - 1 x daily - 7 x weekly - 3 sets - 10 reps Seated Knee Extension with Resistance - 1 x daily - 7 x weekly - 3 sets - 10 reps Single Leg Bridge - 1 x daily - 7 x weekly - 3 sets - 10 reps Side Plank with Clam and Resistance - 1 x daily - 7 x weekly - 3 sets - 10 reps    ASSESSMENT:   CLINICAL IMPRESSION: Blue is  progressing well with therapy.  Pt reports no increase in baseline pain following therapy.  Today we concentrated on core strengthening, quad strengthening, and hip strengthening.  Pt continues to show deficits in quad/hip/core strength; these are improving.  R>L hip abduction weakness.  Heavy cues (verbal and tactile) throughout  Pt will continue to benefit from skilled physical therapy to address remaining deficits and achieve listed goals.  Continue per POC.     REHAB POTENTIAL: Good   CLINICAL DECISION MAKING: Stable/uncomplicated   EVALUATION COMPLEXITY: Low     GOALS: Goals reviewed with patient? Yes   SHORT TERM GOALS:   STG Name Target Date Goal status  1 Sherril will be >75% HEP compliant to improve carryover between sessions and facilitate independent management of condition   Baseline: No HEP 06/09/2021 INITIAL    LONG TERM GOALS:    LTG Name Target Date Goal status   1 Amon will report >/= 50% decrease in pain from evaluation    Baseline: 9/10 max pain 07/14/2021 INITIAL  2 Kaspar will improve the following MMTs to >/= 4/5 to show improvement in strength:  knee and hip abduction     Baseline: see chart in note 07/14/2021 INITIAL  3 Wang will be able to complete work, not limited by pain   Baseline: pain as high as 9/10 07/14/2021 INITIAL    PLAN: PT FREQUENCY: 1-2x/week   PT DURATION: 8 weeks (Ending 07/14/2021)   PLANNED INTERVENTIONS: Therapeutic exercises, Therapeutic activity, Neuro Muscular re-education, Gait training, Patient/Family education, Joint mobilization, Dry Needling, Electrical stimulation, Spinal mobilization and/or manipulation, Moist heat, Taping, Vasopneumatic device, Ionotophoresis 4mg /ml Dexamethasone, and Manual therapy   PLAN FOR NEXT SESSION: progress knee and hip strength, see if gradual loading of pes anserine is effective, activity modification at work    06/23/2021, 8:04 AM

## 2021-07-02 ENCOUNTER — Ambulatory Visit: Payer: Medicaid Other | Admitting: Orthopedic Surgery

## 2021-07-03 ENCOUNTER — Ambulatory Visit (INDEPENDENT_AMBULATORY_CARE_PROVIDER_SITE_OTHER): Payer: Medicaid Other

## 2021-07-03 ENCOUNTER — Encounter: Payer: Self-pay | Admitting: Orthopedic Surgery

## 2021-07-03 ENCOUNTER — Ambulatory Visit (INDEPENDENT_AMBULATORY_CARE_PROVIDER_SITE_OTHER): Payer: Medicaid Other | Admitting: Orthopedic Surgery

## 2021-07-03 ENCOUNTER — Other Ambulatory Visit: Payer: Self-pay

## 2021-07-03 DIAGNOSIS — M25561 Pain in right knee: Secondary | ICD-10-CM | POA: Diagnosis not present

## 2021-07-03 NOTE — Progress Notes (Signed)
Office Visit Note   Patient: Taylor Woods           Date of Birth: 10-31-2000           MRN: [id number] Visit Date: 07/03/2021              Requested by: [name], NP [address] [place],  [place] [address] PCP: [name], NP  Chief Complaint  Patient presents with   Right Knee - Pain, Follow-up      HPI: Patient is a 21 year old gentleman who presents in follow-up for right knee medial joint line pain he states he still has pain he states that after therapy the left knee is feeling better but still has pain in the right knee.  Patient has been using vitamin D3 5000 international units a day as well as vitamin C 2000 mg a day.  Patient states that he has difficulty with weightbearing difficulty with standing at work.  Assessment & Plan: Visit Diagnoses:  1. Acute pain of right knee     Plan: Patient is given a note to minimize standing at work.  We will request an MRI scan to further evaluate the medial joint line for possible physeal injury versus meniscal pathology.  Follow-Up Instructions: Return in about 2 weeks (around 07/17/2021).   Ortho Exam  Patient is alert, oriented, no adenopathy, well-dressed, normal affect, normal respiratory effort. Examination patient has increased laxity of the MCL on the right knee compared to the left knee.  With standing his right knee is in more valgus patient can correct this with standing.  Patient is tender to palpation of the medial joint line anterior drawer is stable.  There is no redness no cellulitis no swelling the bone is nontender to palpation patient denies any night pain.  Imaging: XR Knee 1-2 Views Right  Result Date: 07/03/2021 2 view radiographs of the right knee shows increased valgus of the right knee compared to the left.  The joint spaces congruent no osteochondral defects.  The growth plates are congruent with no joint physis collapse.  No images are attached to the encounter.  Labs: Lab Results   Component Value Date   HGBA1C 5.7 (H) 02/19/2021     Lab Results  Component Value Date   ALBUMIN 4.6 02/19/2021    No results found for: MG Lab Results  Component Value Date   VD25OH 24 (L) 03/01/2021    No results found for: PREALBUMIN CBC EXTENDED Latest Ref Rng & Units 03/01/2021 02/19/2021  WBC 3.8 - 10.8 Thousand/uL 3.9 4.6  RBC 4.20 - 5.80 Million/uL 5.53 5.49  HGB 13.2 - 17.1 g/dL 60.6 77.0  HCT 34.0 - 35.2 % 45.4 45.0  PLT 140 - 400 Thousand/uL 238 243  NEUTROABS 1,500 - 7,800 cells/uL 1,166(L) 1.2(L)  LYMPHSABS 850 - 3,900 cells/uL 2,005 2.4     There is no height or weight on file to calculate BMI.  Orders:  Orders Placed This Encounter  Procedures   XR Knee 1-2 Views Right   No orders of the defined types were placed in this encounter.    Procedures: No procedures performed  Clinical Data: No additional findings.  ROS:  All other systems negative, except as noted in the HPI. Review of Systems  Objective: Vital Signs: There were no vitals taken for this visit.  Specialty Comments:  No specialty comments available.  PMFS History: There are no problems to display for this patient.  No past medical history  on file.  No family history on file.  No past surgical history on file. Social History   Occupational History   Not on file  Tobacco Use   Smoking status: Never   Smokeless tobacco: Never  Substance and Sexual Activity   Alcohol use: Never   Drug use: Never   Sexual activity: Not Currently

## 2021-07-27 ENCOUNTER — Ambulatory Visit
Admission: RE | Admit: 2021-07-27 | Discharge: 2021-07-27 | Disposition: A | Discharge status: Home / Self Care | Payer: Medicaid Other | Source: Ambulatory Visit | Attending: Orthopedic Surgery | Admitting: Orthopedic Surgery

## 2021-07-27 ENCOUNTER — Other Ambulatory Visit: Payer: Self-pay

## 2021-07-27 DIAGNOSIS — M25561 Pain in right knee: Secondary | ICD-10-CM

## 2021-08-30 ENCOUNTER — Ambulatory Visit (INDEPENDENT_AMBULATORY_CARE_PROVIDER_SITE_OTHER): Payer: Medicaid Other | Admitting: Orthopedic Surgery

## 2021-08-30 ENCOUNTER — Encounter: Payer: Self-pay | Admitting: Orthopedic Surgery

## 2021-08-30 DIAGNOSIS — M25561 Pain in right knee: Secondary | ICD-10-CM | POA: Diagnosis not present

## 2021-08-30 NOTE — Progress Notes (Signed)
? ?  Office Visit Note ?  ?Patient: Zyhir Poitras           ?Date of Birth: [birth year]           ?MRN: [id number] ?Visit Date: 08/30/2021 ?             ?Requested by: [name], NP ?[address] ?[place],  [place] [address] ?PCP: Kathrynn Speed, NP ? ?Chief Complaint  ?Patient presents with  ? Right Knee - Follow-up  ?  MRI review  ? ? ? ? ?HPI: ?Patient is a 21 year old gentleman who presents in follow-up with right knee pain.  He is status post an MRI scan.  Pain for 4 years worse in the patellofemoral joint. ? ?Assessment & Plan: ?Visit Diagnoses:  ?1. Acute pain of right knee   ? ? ?Plan: We will set patient up with physical therapy for VMO strengthening. ? ?Follow-Up Instructions: Return if symptoms worsen or fail to improve.  ? ?Ortho Exam ? ?Patient is alert, oriented, no adenopathy, well-dressed, normal affect, normal respiratory effort. ?Examination patient has normal gait with valgus alignment to both knees.  Medial lateral joint lines are nontender to palpation he has tenderness over the lateral joint line to palpation.  No effusion.  Review of the MRI scan shows no meniscal or ligamentous injury there is some edema laterally consistent with lateral impingement in the patellofemoral joint. ? ?Imaging: ?No results found. ?No images are attached to the encounter. ? ?Labs: ?Lab Results  ?Component Value Date  ? HGBA1C 5.7 (H) 02/19/2021  ? ? ? ?Lab Results  ?Component Value Date  ? ALBUMIN 4.6 02/19/2021  ? ? ?No results found for: MG ?Lab Results  ?Component Value Date  ? VD25OH 24 (L) 03/01/2021  ? ? ?No results found for: PREALBUMIN ? ?  Latest Ref Rng & Units 03/01/2021  ? 10:08 AM 02/19/2021  ? 10:30 AM  ?CBC EXTENDED  ?WBC 3.8 - 10.8 Thousand/uL 3.9   4.6    ?RBC 4.20 - 5.80 Million/uL 5.53   5.49    ?Hemoglobin 13.2 - 17.1 g/dL 65.7   84.6    ?HCT 38.5 - 50.0 % 45.4   45.0    ?Platelets 140 - 400 Thousand/uL 238   243    ?NEUT# 1,500 - 7,800 cells/uL 1,166   1.2    ?Lymph# 850 - 3,900 cells/uL  2,005   2.4    ? ? ? ?There is no height or weight on file to calculate BMI. ? ?Orders:  ?No orders of the defined types were placed in this encounter. ? ?No orders of the defined types were placed in this encounter. ? ? ? Procedures: ?No procedures performed ? ?Clinical Data: ?No additional findings. ? ?ROS: ? ?All other systems negative, except as noted in the HPI. ?Review of Systems ? ?Objective: ?Vital Signs: There were no vitals taken for this visit. ? ?Specialty Comments:  ?No specialty comments available. ? ?PMFS History: ?There are no problems to display for this patient. ? ?History reviewed. No pertinent past medical history.  ?History reviewed. No pertinent family history.  ?History reviewed. No pertinent surgical history. ?Social History  ? ?Occupational History  ? Not on file  ?Tobacco Use  ? Smoking status: Never  ? Smokeless tobacco: Never  ?Substance and Sexual Activity  ? Alcohol use: Never  ? Drug use: Never  ? Sexual activity: Not Currently  ? ? ? ? ? ?

## 2021-10-31 ENCOUNTER — Other Ambulatory Visit: Payer: Self-pay

## 2021-10-31 ENCOUNTER — Ambulatory Visit: Payer: Medicaid Other | Attending: Orthopedic Surgery | Admitting: Physical Therapy

## 2021-10-31 ENCOUNTER — Encounter: Payer: Self-pay | Admitting: Physical Therapy

## 2021-10-31 DIAGNOSIS — M25562 Pain in left knee: Secondary | ICD-10-CM | POA: Diagnosis present

## 2021-10-31 DIAGNOSIS — G8929 Other chronic pain: Secondary | ICD-10-CM | POA: Insufficient documentation

## 2021-10-31 DIAGNOSIS — M25561 Pain in right knee: Secondary | ICD-10-CM | POA: Diagnosis present

## 2021-10-31 DIAGNOSIS — M6281 Muscle weakness (generalized): Secondary | ICD-10-CM | POA: Insufficient documentation

## 2021-10-31 DIAGNOSIS — R2689 Other abnormalities of gait and mobility: Secondary | ICD-10-CM | POA: Diagnosis present

## 2021-10-31 NOTE — Therapy (Signed)
OUTPATIENT PHYSICAL THERAPY LOWER EXTREMITY EVALUATION   Patient Name: Taylor Woods MRN: [id number] DOB:06/09/2000, 21 y.o., male Today's Date: 10/31/2021   PT End of Session - 10/31/21 1103     Visit Number 1    Number of Visits 9    Date for PT Re-Evaluation 12/26/21    Authorization Type [employer]    PT Start Time 1024    PT Stop Time 1101    PT Time Calculation (min) 37 min    Activity Tolerance Patient tolerated treatment well    Behavior During Therapy [facility] for tasks assessed/performed             History reviewed. No pertinent past medical history. History reviewed. No pertinent surgical history. There are no problems to display for this patient.   PCP: Kathrynn Speed, NP  REFERRING PROVIDER: Nadara Mustard, MD  REFERRING DIAG: Acute pain of right knee [M25.561]  THERAPY DIAG:  Chronic pain of right knee  Chronic pain of left knee  Muscle weakness (generalized)  Other abnormalities of gait and mobility  Rationale for Evaluation and Treatment Rehabilitation  ONSET DATE: 2019  SUBJECTIVE:   SUBJECTIVE STATEMENT: Pt reports pain in the R knee started in 2019 that occurred with no specific. Pain stays most in the knee R>L but occur mostly on the R. Pain is located mostly along the anteromeidal aspect of the knee bil.   PERTINENT HISTORY: Chronic R knee pain  PAIN:  Are you having pain? Yes: NPRS scale: 10/10 with walking and running and with sitting its 4/10 Pain location: medial apsect of the knee Pain description: sharp Aggravating factors: walking / running Relieving factors: N/A  PRECAUTIONS: None  WEIGHT BEARING RESTRICTIONS No  FALLS:  Has patient fallen in last 6 months? No  LIVING ENVIRONMENT: Lives with: lives with their family Lives in: House/apartment Stairs: No Has following equipment at home: None  OCCUPATION: [employer]: prolonged sitting  PLOF: Independent  PATIENT GOALS improve pain in the R knee   OBJECTIVE:    DIAGNOSTIC FINDINGS:  07/27/2021 MRI R knee IMPRESSION: 1. No meniscal or ligamentous injury of the right knee. 2. Mild edema in superolateral Hoffa's fat-pad as can be seen with patellar tendon-lateral femoral condyle friction syndrome.  PATIENT SURVEYS:  LEFS 52/80  COGNITION:  Overall cognitive status: Within functional limits for tasks assessed       POSTURE: rounded shoulders and forward head Genu Valgum bil   PALPATION: TTP along the medial joint line and around the pes anserine, and MCL   LOWER EXTREMITY ROM:  Active ROM Right eval Left eval  Hip flexion    Hip extension    Hip abduction    Hip adduction    Hip internal rotation    Hip external rotation    Knee flexion [facility]  Knee extension [facility]  Ankle dorsiflexion    Ankle plantarflexion    Ankle inversion    Ankle eversion     (Blank rows = not tested)  LOWER EXTREMITY MMT:  MMT Right eval Left eval  Hip flexion 4/5 4/5  Hip extension 4/5 4/5  Hip abduction 3+/5 3+/5  Hip adduction    Hip internal rotation    Hip external rotation    Knee flexion 4/5 P! 4/5  Knee extension 4/5 4/5  Ankle dorsiflexion    Ankle plantarflexion    Ankle inversion    Ankle eversion     (Blank rows = not tested)  LOWER EXTREMITY SPECIAL  TESTS:  Knee special tests: Valgus special test   GAIT: Distance walked: 100 ft (from waiting room to treatment area) Assistive device utilized: None Level of assistance: Complete Independence Comments: excessive R knee genu valgum, increased lateral heel whip and trendelenberg pattern when     standing on the R LE   TODAY'S TREATMENT:     PATIENT EDUCATION:  Education details: evaluation findings, POC, goals, HEP with proper form/ rationale Person educated: Patient Education method: Explanation, Demonstration, Verbal cues, and Handouts Education comprehension: verbalized understanding and verbal cues required   HOME EXERCISE PROGRAM: Access Code:  [email or web address] URL: [email or web address] Date: 10/31/2021 Prepared by: Lulu RidingKristoffer Kody Vigil  Exercises - Sidelying Hip Abduction  - 1 x daily - 7 x weekly - 3 sets - 15 reps - Supine Bridge with Gluteal Set and Spinal Articulation  - 1 x daily - 7 x weekly - 2 sets - 10 reps - 5 seconds hold - Seated Hamstring Stretch  - 1 x daily - 7 x weekly - 2 sets - 2 reps - 30 hold - Supine Hamstring Stretch with Strap  - 1 x daily - 7 x weekly - 2 sets - 2 reps - 30 hold - Standing Hip Flexor Stretch  - 1 x daily - 7 x weekly - 2 sets - 2 reps - 30 hold  ASSESSMENT:  CLINICAL IMPRESSION: Patient is a 21 y.o. M who was seen today for physical therapy evaluation and treatment for R knee pain. Pt reports onset of R knee pain in 2019 with no specific onset or MOI. He has functional knee ROM with bil le weakness with R>L. He also has bil genu valgum with R>L and TTP located along the medial aspect of the knee/ pes anserine. He required increased tactile/ verbal cues throughout session likely as a result of language barrier. He would benefit from physical therapy to decrease bil knee pain, improve RLE strength, maximize his function by addressing the deficits listed.    OBJECTIVE IMPAIRMENTS Abnormal gait, decreased activity tolerance, decreased endurance, decreased strength, and pain.   ACTIVITY LIMITATIONS standing and walking/ jogging  PARTICIPATION LIMITATIONS: shopping, occupation, and sports  PERSONAL FACTORS Time since onset of injury/illness/exacerbation are also affecting patient's functional outcome.   REHAB POTENTIAL: Good  CLINICAL DECISION MAKING: Stable/uncomplicated  EVALUATION COMPLEXITY: Low   GOALS: Goals reviewed with patient? Yes  SHORT TERM GOALS: Target date: 11/21/2021   Pt to be IND with initial HEP for therapeutic progression Baseline: no previous HEP Goal status: INITIAL  2.  Pt to verbalize/ demo proper posture, lifting/ running mechanics to reduce and  prevent bil knee pain Baseline: no previous knowledge of proper posture Goal status: INITIAL   LONG TERM GOALS: Target date: 12/12/2021   Increase bil gross hip strength to promote knee stability and reduce progression of genu valgum. Baseline: see flow sheet Goal status: INITIAL  2.  Pt to be able to walk >/= 30 min with </= 5/10 max pain for improvement in condition Baseline: via faces scale pt reported at worst 10/10 pain Goal status: INITIAL  3.  Increase LEFS score to >/= 70 to demo improvement in function Baseline: 52/80 Goal status: INITIAL  4.  Pt to be able to perform interval training of walking/ running with max pain at </= 5/10 for pt's goals of returning to running  Baseline: worst pain is 10/10 Goal status: INITIAL  5.  Pt to be IND with all HEP to be able to progress his current LOF IND  Baseline: no previous HEP Goal status: INITIAL PLAN: PT FREQUENCY: 1-2x/week  PT DURATION: 6 weeks  PLANNED INTERVENTIONS: Therapeutic exercises, Therapeutic activity, Neuromuscular re-education, Balance training, Gait training, Patient/Family education, Joint manipulation, Joint mobilization, Stair training, Dry Needling, Cryotherapy, Moist heat, Taping, Manual therapy, and Re-evaluation  PLAN FOR NEXT SESSION: Review/ update HEP PRN, hip abductor strengthening, posterior tib activation, trial arch taping to reduce valgus knee positioning. **may require additional cueing / tactile cueing due to language barrier.   Kacelyn Rowzee PT, DPT, LAT, ATC  10/31/21  12:16 PM        Check all possible CPT codes: 73419 - Re-evaluation, 97110- Therapeutic Exercise, [phone]- Neuro Re-education, [phone] - Gait Training, [phone] - Manual Therapy, 97530 - Therapeutic Activities, 97535 - Self Care, [phone] - Ultrasound, and 97750 - Physical performance training     If treatment provided at initial evaluation, no treatment charged due to lack of authorization.

## 2021-11-15 ENCOUNTER — Encounter: Payer: Self-pay | Admitting: Physical Therapy

## 2021-11-15 ENCOUNTER — Ambulatory Visit: Payer: Medicaid Other | Attending: Orthopedic Surgery | Admitting: Physical Therapy

## 2021-11-15 DIAGNOSIS — M25562 Pain in left knee: Secondary | ICD-10-CM | POA: Insufficient documentation

## 2021-11-15 DIAGNOSIS — M6281 Muscle weakness (generalized): Secondary | ICD-10-CM | POA: Insufficient documentation

## 2021-11-15 DIAGNOSIS — R2689 Other abnormalities of gait and mobility: Secondary | ICD-10-CM | POA: Insufficient documentation

## 2021-11-15 DIAGNOSIS — M25561 Pain in right knee: Secondary | ICD-10-CM | POA: Diagnosis present

## 2021-11-15 DIAGNOSIS — G8929 Other chronic pain: Secondary | ICD-10-CM | POA: Insufficient documentation

## 2021-11-15 NOTE — Therapy (Signed)
OUTPATIENT PHYSICAL THERAPY TREATMENT NOTE   Patient Name: Taylor Woods MRN: [id number] DOB:2000/08/23, 21 y.o., male 42 Date: 11/15/2021  PCP: Kathrynn Speed, NP   REFERRING PROVIDER: Nadara Mustard, MD  END OF SESSION:   PT End of Session - 11/15/21 1106     Visit Number 2    Number of Visits 9    Date for PT Re-Evaluation 12/26/21    Authorization Type [employer]    Authorization Time Period 10/31/21-11/29/21    Authorization - Visit Number 1    Authorization - Number of Visits 4    PT Start Time 1105    PT Stop Time 1145    PT Time Calculation (min) 40 min             History reviewed. No pertinent past medical history. History reviewed. No pertinent surgical history. There are no problems to display for this patient.   REFERRING DIAG: Acute pain of right knee [M25.561]   THERAPY DIAG:  Chronic pain of right knee  Muscle weakness (generalized)  Rationale for Evaluation and Treatment Rehabilitation  PERTINENT HISTORY: Chronic R knee pain   PRECAUTIONS:  None   SUBJECTIVE: There is no change. I have been doing the exercises.   PAIN:  Are you having pain? Yes: NPRS scale: 7/10 with walking  Pain location: medial apsect of the knee Pain description: sharp Aggravating factors: walking / running Relieving factors: N/A   OBJECTIVE: (objective measures completed at initial evaluation unless otherwise dated)   DIAGNOSTIC FINDINGS:  07/27/2021 MRI R knee IMPRESSION: 1. No meniscal or ligamentous injury of the right knee. 2. Mild edema in superolateral Hoffa's fat-pad as can be seen with patellar tendon-lateral femoral condyle friction syndrome.   PATIENT SURVEYS:  LEFS 52/80   COGNITION:           Overall cognitive status: Within functional limits for tasks assessed                              POSTURE: rounded shoulders and forward head Genu Valgum bil    PALPATION: TTP along the medial joint line and around the pes anserine, and  MCL    LOWER EXTREMITY ROM:   Active ROM Right eval Left eval  Hip flexion      Hip extension      Hip abduction      Hip adduction      Hip internal rotation      Hip external rotation      Knee flexion [facility] W[facility]  Knee extension [facility]  Ankle dorsiflexion      Ankle plantarflexion      Ankle inversion      Ankle eversion       (Blank rows = not tested)   LOWER EXTREMITY MMT:   MMT Right eval Left eval  Hip flexion 4/5 4/5  Hip extension 4/5 4/5  Hip abduction 3+/5 3+/5  Hip adduction      Hip internal rotation      Hip external rotation      Knee flexion 4/5 P! 4/5  Knee extension 4/5 4/5  Ankle dorsiflexion      Ankle plantarflexion      Ankle inversion      Ankle eversion       (Blank rows = not tested)   LOWER EXTREMITY SPECIAL TESTS:  Knee special tests: Valgus special test     GAIT: Distance  walked: 100 ft (from waiting room to treatment area) Assistive device utilized: None Level of assistance: Complete Independence Comments: excessive R knee genu valgum, increased lateral heel whip and trendelenberg pattern when                                                 standing on the R LE     TODAY'S TREATMENT:  [facility] Adult PT Treatment:                                                DATE: 11/15/21 Therapeutic Exercise: Rec bike L3 x 5 minutes Right QS 5 sec x 20.Left x 20  Bridge x10, Bridge with ball squeeze x 20  Hamstring stretch Hip abduction with green theraband x 20 each S/L Clam with green theraband  x 20 each  Bridge with green band clam x 20  S/L Quad stretch 30 sec x 2 each  Heel raise with tennis ball at ankles  Gastroc stretch at wall          PATIENT EDUCATION:  Education details: evaluation findings, POC, goals, HEP with proper form/ rationale Person educated: Patient Education method: Explanation, Demonstration, Verbal cues, and Handouts Education comprehension: verbalized understanding and verbal cues required     HOME EXERCISE  PROGRAM: Access Code: [email or web address] URL: [email or web address] Date: 11/15/2021 Prepared by: Jannette Spanner  Exercises - Sidelying Hip Abduction  - 1 x daily - 7 x weekly - 3 sets - 15 reps - Supine Bridge with Gluteal Set and Spinal Articulation  - 1 x daily - 7 x weekly - 2 sets - 10 reps - 5 seconds hold - Seated Hamstring Stretch  - 1 x daily - 7 x weekly - 2 sets - 2 reps - 30 hold - Supine Hamstring Stretch with Strap  - 1 x daily - 7 x weekly - 2 sets - 2 reps - 30 hold - Standing Hip Flexor Stretch  - 1 x daily - 7 x weekly - 2 sets - 2 reps - 30 hold - Standing Calf Raise With Small Ball at Heels  - 1 x daily - 7 x weekly - 2 sets - 10 reps - Gastroc Stretch on Wall  - 1 x daily - 7 x weekly - 3 sets - 10 reps - Clam with Resistance  - 1 x daily - 7 x weekly - 2 sets - 10 reps   ASSESSMENT:   CLINICAL IMPRESSION: Patient is a 21 y.o. M who was seen today for physical therapy treatment for R knee pain. He reports no change since last visit 2 weeks ago and reports compliance with HEP. Progressed per PT POC  and added additional hip strength and post tib activation. No c/o pain during session.  He would benefit from physical therapy to decrease bil knee pain, improve RLE strength, maximize his function by addressing the deficits listed.      OBJECTIVE IMPAIRMENTS Abnormal gait, decreased activity tolerance, decreased endurance, decreased strength, and pain.    ACTIVITY LIMITATIONS standing and walking/ jogging   PARTICIPATION LIMITATIONS: shopping, occupation, and sports   PERSONAL FACTORS Time since onset of injury/illness/exacerbation are also affecting patient's functional outcome.    REHAB POTENTIAL: Good  CLINICAL DECISION MAKING: Stable/uncomplicated   EVALUATION COMPLEXITY: Low     GOALS: Goals reviewed with patient? Yes   SHORT TERM GOALS: Target date: 11/21/2021    Pt to be IND with initial HEP for therapeutic progression Baseline: no previous  HEP Goal status: INITIAL   2.  Pt to verbalize/ demo proper posture, lifting/ running mechanics to reduce and prevent bil knee pain Baseline: no previous knowledge of proper posture Goal status: INITIAL     LONG TERM GOALS: Target date: 12/12/2021    Increase bil gross hip strength to promote knee stability and reduce progression of genu valgum. Baseline: see flow sheet Goal status: INITIAL   2.  Pt to be able to walk >/= 30 min with </= 5/10 max pain for improvement in condition Baseline: via faces scale pt reported at worst 10/10 pain Goal status: INITIAL   3.  Increase LEFS score to >/= 70 to demo improvement in function Baseline: 52/80 Goal status: INITIAL   4.  Pt to be able to perform interval training of walking/ running with max pain at </= 5/10 for pt's goals of returning to running  Baseline: worst pain is 10/10 Goal status: INITIAL   5.  Pt to be IND with all HEP to be able to progress his current LOF IND Baseline: no previous HEP Goal status: INITIAL PLAN: PT FREQUENCY: 1-2x/week   PT DURATION: 6 weeks   PLANNED INTERVENTIONS: Therapeutic exercises, Therapeutic activity, Neuromuscular re-education, Balance training, Gait training, Patient/Family education, Joint manipulation, Joint mobilization, Stair training, Dry Needling, Cryotherapy, Moist heat, Taping, Manual therapy, and Re-evaluation   PLAN FOR NEXT SESSION: Review/ update HEP PRN, hip abductor strengthening, posterior tib activation, trial arch taping to reduce valgus knee positioning. **may require additional cueing / tactile cueing due to language barrier.      Jannette Spanner, PTA 11/15/21 1:11 PM Phone: [phone] Fax: [phone]

## 2021-11-21 ENCOUNTER — Encounter: Payer: Self-pay | Admitting: Physical Therapy

## 2021-11-21 ENCOUNTER — Ambulatory Visit: Payer: Medicaid Other | Admitting: Physical Therapy

## 2021-11-21 DIAGNOSIS — R2689 Other abnormalities of gait and mobility: Secondary | ICD-10-CM

## 2021-11-21 DIAGNOSIS — G8929 Other chronic pain: Secondary | ICD-10-CM

## 2021-11-21 DIAGNOSIS — M25561 Pain in right knee: Secondary | ICD-10-CM | POA: Diagnosis not present

## 2021-11-21 DIAGNOSIS — M6281 Muscle weakness (generalized): Secondary | ICD-10-CM

## 2021-11-21 NOTE — Therapy (Signed)
OUTPATIENT PHYSICAL THERAPY TREATMENT NOTE   Patient Name: Taylor Woods MRN: [id number] DOB:Nov 01, 2000, 21 y.o., male 89 Date: 11/21/2021  PCP: Teena Dunk, NP   REFERRING PROVIDER: Newt Minion, MD  END OF SESSION:   PT End of Session - 11/21/21 1024     Visit Number 3    Number of Visits 9    Date for PT Re-Evaluation 12/26/21    Authorization Type [employer]    Authorization Time Period 10/31/21-11/29/21    Authorization - Visit Number 2    Authorization - Number of Visits 4    PT Start Time 1020    PT Stop Time 1100    PT Time Calculation (min) 40 min             History reviewed. No pertinent past medical history. History reviewed. No pertinent surgical history. There are no problems to display for this patient.   REFERRING DIAG: Acute pain of right knee [M25.561]   THERAPY DIAG:  Chronic pain of right knee  Muscle weakness (generalized)  Other abnormalities of gait and mobility  Chronic pain of left knee  Rationale for Evaluation and Treatment Rehabilitation  PERTINENT HISTORY: Chronic R knee pain   PRECAUTIONS:  None   SUBJECTIVE: There is still no change. I did the exercises every day.   PAIN:  Are you having pain? Yes: Taylor Woods scale: 7/10 with walking  Pain location: medial apsect of the knee Pain description: sharp Aggravating factors: walking / running Relieving factors: N/A   OBJECTIVE: (objective measures completed at initial evaluation unless otherwise dated)   DIAGNOSTIC FINDINGS:  07/27/2021 MRI R knee IMPRESSION: 1. No meniscal or ligamentous injury of the right knee. 2. Mild edema in superolateral Hoffa's fat-pad as can be seen with patellar tendon-lateral femoral condyle friction syndrome.   PATIENT SURVEYS:  LEFS 52/80   COGNITION:           Overall cognitive status: Within functional limits for tasks assessed                              POSTURE: rounded shoulders and forward head Genu Valgum bil     PALPATION: TTP along the medial joint line and around the pes anserine, and MCL    LOWER EXTREMITY ROM:   Active ROM Right eval Left eval  Hip flexion      Hip extension      Hip abduction      Hip adduction      Hip internal rotation      Hip external rotation      Knee flexion [facility]  Knee extension [facility]  Ankle dorsiflexion      Ankle plantarflexion      Ankle inversion      Ankle eversion       (Blank rows = not tested)   LOWER EXTREMITY MMT:   MMT Right eval Left eval 11/21/21  Hip flexion 4/5 4/5 4+/5 bilat  Hip extension 4/5 4/5   Hip abduction 3+/5 3+/5   Hip adduction       Hip internal rotation       Hip external rotation       Knee flexion 4/5 P! 4/5   Knee extension 4/5 4/5   Ankle dorsiflexion       Ankle plantarflexion       Ankle inversion       Ankle eversion        (  Blank rows = not tested)   LOWER EXTREMITY SPECIAL TESTS:  Knee special tests: Valgus special test     GAIT: Distance walked: 100 ft (from waiting room to treatment area) Assistive device utilized: None Level of assistance: Complete Independence Comments: excessive R knee genu valgum, increased lateral heel whip and trendelenberg pattern when                                                 standing on the R LE     TODAY'S TREATMENT: [facility] Adult PT Treatment:                                                DATE: 11/21/21 Therapeutic Exercise: REc bike x 5 min 6 inch step opp knee drive 10 x 2 right  SLS 30  sec x 2 -cues to avoid hyperextension Single leg hip hinge with opp LE towel slide back 10 x 2 each  Rebonder toss red ball - increased pain on right  STS from mat  STS from mat with green band x 10- mod cues for control Monster walks with green band- cues for LE joint alignment Lateral band walks  with green band -  Side hip abduction green band 10 x 2      [facility] Adult PT Treatment:                                                DATE: 11/15/21 Therapeutic  Exercise: Rec bike L3 x 5 minutes Right QS 5 sec x 20.Left x 20  Bridge x10, Bridge with ball squeeze x 20  Hamstring stretch Hip abduction with green theraband x 20 each S/L Clam with green theraband  x 20 each  Bridge with green band clam x 20  S/L Quad stretch 30 sec x 2 each  Heel raise with tennis ball at ankles  Gastroc stretch at wall          PATIENT EDUCATION:  Education details: evaluation findings, POC, goals, HEP with proper form/ rationale Person educated: Patient Education method: Explanation, Demonstration, Verbal cues, and Handouts Education comprehension: verbalized understanding and verbal cues required     HOME EXERCISE PROGRAM: Access Code: [email or web address] URL: [email or web address] Date: 11/15/2021 Prepared by: Hessie Diener  Exercises - Sidelying Hip Abduction  - 1 x daily - 7 x weekly - 3 sets - 15 reps - Supine Bridge with Gluteal Set and Spinal Articulation  - 1 x daily - 7 x weekly - 2 sets - 10 reps - 5 seconds hold - Seated Hamstring Stretch  - 1 x daily - 7 x weekly - 2 sets - 2 reps - 30 hold - Supine Hamstring Stretch with Strap  - 1 x daily - 7 x weekly - 2 sets - 2 reps - 30 hold - Standing Hip Flexor Stretch  - 1 x daily - 7 x weekly - 2 sets - 2 reps - 30 hold - Standing Calf Raise With Small Ball at Heels  - 1 x daily - 7 x weekly - 2 sets - 10 reps -  Gastroc Stretch on Wall  - 1 x daily - 7 x weekly - 3 sets - 10 reps - Clam with Resistance  - 1 x daily - 7 x weekly - 2 sets - 10 reps   ASSESSMENT:   CLINICAL IMPRESSION: Patient is a 21 y.o. M who was seen today for physical therapy treatment for R knee pain. He reports no change to date and he reports daily compliance with HEP. His complaint is pain with walking and attempts at running. He is unable to play basketball.  He denies pain with transfers  and does not climb stairs. Began closed chain therex today with pain reproduced during dynamic R SLS. Able to work on closed chain  knee/hip strength with good tolerance. Mod cues for monster walks to maintain neutral alignment in LE. Pt is aware of Right > L knee valgus contributing to his pain. Briefly discussed arch supports for assist.  Updated HEP with closed chain therex.   He would benefit from physical therapy to decrease bil knee pain, improve RLE strength, maximize his function by addressing the deficits listed.      OBJECTIVE IMPAIRMENTS Abnormal gait, decreased activity tolerance, decreased endurance, decreased strength, and pain.    ACTIVITY LIMITATIONS standing and walking/ jogging   PARTICIPATION LIMITATIONS: shopping, occupation, and sports   PERSONAL FACTORS Time since onset of injury/illness/exacerbation are also affecting patient's functional outcome.    REHAB POTENTIAL: Good   CLINICAL DECISION MAKING: Stable/uncomplicated   EVALUATION COMPLEXITY: Low     GOALS: Goals reviewed with patient? Yes   SHORT TERM GOALS: Target date: 11/21/2021    Pt to be IND with initial HEP for therapeutic progression Baseline: no previous HEP Goal status: MET   2.  Pt to verbalize/ demo proper posture, lifting/ running mechanics to reduce and prevent bil knee pain Baseline: no previous knowledge of proper posture Goal status: INITIAL     LONG TERM GOALS: Target date: 12/12/2021    Increase bil gross hip strength to promote knee stability and reduce progression of genu valgum. Baseline: see flow sheet Goal status: INITIAL   2.  Pt to be able to walk >/= 30 min with </= 5/10 max pain for improvement in condition Baseline: via faces scale pt reported at worst 10/10 pain Goal status: INITIAL   3.  Increase LEFS score to >/= 70 to demo improvement in function Baseline: 52/80 Goal status: INITIAL   4.  Pt to be able to perform interval training of walking/ running with max pain at </= 5/10 for pt's goals of returning to running  Baseline: worst pain is 10/10 Goal status: INITIAL   5.  Pt to be IND with  all HEP to be able to progress his current LOF IND Baseline: no previous HEP Goal status: INITIAL PLAN: PT FREQUENCY: 1-2x/week   PT DURATION: 6 weeks   PLANNED INTERVENTIONS: Therapeutic exercises, Therapeutic activity, Neuromuscular re-education, Balance training, Gait training, Patient/Family education, Joint manipulation, Joint mobilization, Stair training, Dry Needling, Cryotherapy, Moist heat, Taping, Manual therapy, and Re-evaluation   PLAN FOR NEXT SESSION: assess response to additions to HEP ;Review/ update HEP PRN, hip abductor strengthening, posterior tib activation, trial arch taping to reduce valgus knee positioning. **may require additional cueing / tactile cueing due to language barrier.      Hessie Diener, PTA 11/21/21 12:21 PM Phone: [phone] Fax: [phone]

## 2021-11-28 ENCOUNTER — Ambulatory Visit: Payer: Medicaid Other | Admitting: Physical Therapy

## 2021-11-28 ENCOUNTER — Encounter: Payer: Self-pay | Admitting: Physical Therapy

## 2021-11-28 DIAGNOSIS — G8929 Other chronic pain: Secondary | ICD-10-CM

## 2021-11-28 DIAGNOSIS — M25561 Pain in right knee: Secondary | ICD-10-CM | POA: Diagnosis not present

## 2021-11-28 DIAGNOSIS — R2689 Other abnormalities of gait and mobility: Secondary | ICD-10-CM

## 2021-11-28 DIAGNOSIS — M6281 Muscle weakness (generalized): Secondary | ICD-10-CM

## 2021-11-28 NOTE — Therapy (Signed)
OUTPATIENT PHYSICAL THERAPY TREATMENT NOTE   Patient Name: Taylor Woods MRN: [id number] DOB:Mar 26, 2001, 21 y.o., male 37 Date: 11/28/2021  PCP: Teena Dunk, NP   REFERRING PROVIDER: Newt Minion, MD  END OF SESSION:   PT End of Session - 11/28/21 1022     Visit Number 4    Number of Visits 9    Date for PT Re-Evaluation 12/26/21    Authorization Type [employer]    Authorization Time Period 10/31/21-11/29/21    Authorization - Visit Number 3    Authorization - Number of Visits 4    PT Start Time 1020    PT Stop Time 1058    PT Time Calculation (min) 38 min             History reviewed. No pertinent past medical history. History reviewed. No pertinent surgical history. There are no problems to display for this patient.   REFERRING DIAG: Acute pain of right knee [M25.561]   THERAPY DIAG:  Chronic pain of right knee  Muscle weakness (generalized)  Other abnormalities of gait and mobility  Rationale for Evaluation and Treatment Rehabilitation  PERTINENT HISTORY: Chronic R knee pain   PRECAUTIONS:  None   SUBJECTIVE: There is still no change in the right knee. The left knee is better. I am doing the new exercises.    PAIN:  Are you having pain? Yes: NPRS scale: 7/10 with walking  Pain location: medial apsect of the knee Pain description: sharp Aggravating factors: walking / running Relieving factors: N/A   OBJECTIVE: (objective measures completed at initial evaluation unless otherwise dated)   DIAGNOSTIC FINDINGS:  07/27/2021 MRI R knee IMPRESSION: 1. No meniscal or ligamentous injury of the right knee. 2. Mild edema in superolateral Hoffa's fat-pad as can be seen with patellar tendon-lateral femoral condyle friction syndrome.   PATIENT SURVEYS:  LEFS 52/80   COGNITION:           Overall cognitive status: Within functional limits for tasks assessed                              POSTURE: rounded shoulders and forward head Genu  Valgum bil    PALPATION: TTP along the medial joint line and around the pes anserine, and MCL    LOWER EXTREMITY ROM:   Active ROM Right eval Left eval  Hip flexion      Hip extension      Hip abduction      Hip adduction      Hip internal rotation      Hip external rotation      Knee flexion [facility]  Knee extension [facility]  Ankle dorsiflexion      Ankle plantarflexion      Ankle inversion      Ankle eversion       (Blank rows = not tested)   LOWER EXTREMITY MMT:   MMT Right eval Left eval 11/21/21  Hip flexion 4/5 4/5 4+/5 bilat  Hip extension 4/5 4/5   Hip abduction 3+/5 3+/5   Hip adduction       Hip internal rotation       Hip external rotation       Knee flexion 4/5 P! 4/5   Knee extension 4/5 4/5   Ankle dorsiflexion       Ankle plantarflexion       Ankle inversion       Ankle  eversion        (Blank rows = not tested)   LOWER EXTREMITY SPECIAL TESTS:  Knee special tests: Valgus special test     GAIT: Distance walked: 100 ft (from waiting room to treatment area) Assistive device utilized: None Level of assistance: Complete Independence Comments: excessive R knee genu valgum, increased lateral heel whip and trendelenberg pattern when                                                 standing on the R LE     TODAY'S TREATMENT: [facility] Adult PT Treatment:                                                DATE: 11/28/21 Therapeutic Exercise: REc bike x 5 min 8 inch step opp knee drive 10 x 2 right  4 inch step down heel strikes R/L x 15 each STS from mat with green band x 10- mod cues for control Lateral band walks  with green band -  Rebonder toss red ball - increased pain on right when flexed more than 15 to 20 degrees  STS from mat  with blue band  TKE with ball behind knee at wall x 10 each 5 sec   Manual Therapy : Right knee Fat pad tape with KT - pt reports no change  with walking or SLS  [facility] Adult PT Treatment:                                                 DATE: 11/21/21 Therapeutic Exercise: REc bike x 5 min 6 inch step opp knee drive 10 x 2 right  SLS 30  sec x 2 -cues to avoid hyperextension Single leg hip hinge with opp LE towel slide back 10 x 2 each  Rebonder toss red ball - increased pain on right  STS from mat  STS from mat with green band x 10- mod cues for control Monster walks with green band- cues for LE joint alignment Lateral band walks  with green band -  Side hip abduction green band 10 x 2      [facility] Adult PT Treatment:                                                DATE: 11/15/21 Therapeutic Exercise: Rec bike L3 x 5 minutes Right QS 5 sec x 20.Left x 20  Bridge x10, Bridge with ball squeeze x 20  Hamstring stretch Hip abduction with green theraband x 20 each S/L Clam with green theraband  x 20 each  Bridge with green band clam x 20  S/L Quad stretch 30 sec x 2 each  Heel raise with tennis ball at ankles  Gastroc stretch at wall          PATIENT EDUCATION:  Education details: evaluation findings, POC, goals, HEP with proper form/ rationale Person educated: Patient Education method: Consulting civil engineer, Media planner, Verbal  cues, and Handouts Education comprehension: verbalized understanding and verbal cues required     HOME EXERCISE PROGRAM: Access Code: [email or web address] URL: [email or web address] Date: 11/15/2021 Prepared by: Hessie Diener  Exercises - Sidelying Hip Abduction  - 1 x daily - 7 x weekly - 3 sets - 15 reps - Supine Bridge with Gluteal Set and Spinal Articulation  - 1 x daily - 7 x weekly - 2 sets - 10 reps - 5 seconds hold - Seated Hamstring Stretch  - 1 x daily - 7 x weekly - 2 sets - 2 reps - 30 hold - Supine Hamstring Stretch with Strap  - 1 x daily - 7 x weekly - 2 sets - 2 reps - 30 hold - Standing Hip Flexor Stretch  - 1 x daily - 7 x weekly - 2 sets - 2 reps - 30 hold - Standing Calf Raise With Small Ball at Heels  - 1 x daily - 7 x weekly - 2 sets - 10 reps - Gastroc  Stretch on Wall  - 1 x daily - 7 x weekly - 3 sets - 10 reps - Clam with Resistance  - 1 x daily - 7 x weekly - 2 sets - 10 reps   ASSESSMENT:   CLINICAL IMPRESSION: Patient is a 21 y.o. M who was seen today for physical therapy treatment for R knee pain. He reports no change to date and he reports daily compliance with HEP.  Trial of fat pad tape with no improvement. His right knee valgus remains > on right and he is able to correct the valgus but with feeling of instability. Unable to hold 5 sec SLS in this position. Continued with TKE strengthening, avoiding hyper extension as well as verbal cues to avoid hyper extension. Continued education on benefits of neutral knee alignment and strengthening knee and surrounding joints.  He would benefit from physical therapy to decrease bil knee pain, improve RLE strength, maximize his function by addressing the deficits listed.      OBJECTIVE IMPAIRMENTS Abnormal gait, decreased activity tolerance, decreased endurance, decreased strength, and pain.    ACTIVITY LIMITATIONS standing and walking/ jogging   PARTICIPATION LIMITATIONS: shopping, occupation, and sports   PERSONAL FACTORS Time since onset of injury/illness/exacerbation are also affecting patient's functional outcome.    REHAB POTENTIAL: Good   CLINICAL DECISION MAKING: Stable/uncomplicated   EVALUATION COMPLEXITY: Low     GOALS: Goals reviewed with patient? Yes   SHORT TERM GOALS: Target date: 11/21/2021    Pt to be IND with initial HEP for therapeutic progression Baseline: no previous HEP Goal status: MET   2.  Pt to verbalize/ demo proper posture, lifting/ running mechanics to reduce and prevent bil knee pain Baseline: no previous knowledge of proper posture Status 11/28/21: needs mod cues for hip hinge  Goal status: INITIAL     LONG TERM GOALS: Target date: 12/12/2021    Increase bil gross hip strength to promote knee stability and reduce progression of genu  valgum. Baseline: see flow sheet Goal status: INITIAL   2.  Pt to be able to walk >/= 30 min with </= 5/10 max pain for improvement in condition Baseline: via faces scale pt reported at worst 10/10 pain Goal status: INITIAL   3.  Increase LEFS score to >/= 70 to demo improvement in function Baseline: 52/80 Goal status: INITIAL   4.  Pt to be able to perform interval training of walking/ running with max pain at </= 5/10 for pt's goals  of returning to running  Baseline: worst pain is 10/10 Goal status: INITIAL   5.  Pt to be IND with all HEP to be able to progress his current LOF IND Baseline: no previous HEP Goal status: INITIAL PLAN: PT FREQUENCY: 1-2x/week   PT DURATION: 6 weeks   PLANNED INTERVENTIONS: Therapeutic exercises, Therapeutic activity, Neuromuscular re-education, Balance training, Gait training, Patient/Family education, Joint manipulation, Joint mobilization, Stair training, Dry Needling, Cryotherapy, Moist heat, Taping, Manual therapy, and Re-evaluation   PLAN FOR NEXT SESSION: assess response to additions to HEP ;Review/ update HEP PRN, hip abductor strengthening, posterior tib activation, trial arch taping to reduce valgus knee positioning. **may require additional cueing / tactile cueing due to language barrier. Try different tape      Hessie Diener, PTA 11/28/21 12:15 PM Phone: [phone] Fax: [phone]

## 2021-12-11 ENCOUNTER — Encounter: Payer: Self-pay | Admitting: Physical Therapy

## 2021-12-11 ENCOUNTER — Ambulatory Visit: Payer: Medicaid Other | Attending: Orthopedic Surgery | Admitting: Physical Therapy

## 2021-12-11 DIAGNOSIS — R2689 Other abnormalities of gait and mobility: Secondary | ICD-10-CM | POA: Insufficient documentation

## 2021-12-11 DIAGNOSIS — M25561 Pain in right knee: Secondary | ICD-10-CM | POA: Insufficient documentation

## 2021-12-11 DIAGNOSIS — M6281 Muscle weakness (generalized): Secondary | ICD-10-CM | POA: Diagnosis present

## 2021-12-11 DIAGNOSIS — G8929 Other chronic pain: Secondary | ICD-10-CM | POA: Diagnosis present

## 2021-12-11 DIAGNOSIS — M25562 Pain in left knee: Secondary | ICD-10-CM | POA: Insufficient documentation

## 2021-12-11 NOTE — Therapy (Signed)
OUTPATIENT PHYSICAL THERAPY TREATMENT NOTE / RE-CERTIFICATION   Patient Name: Taylor Woods MRN: [id number] DOB:2000/06/16, 21 y.o., male 62 Date: 12/11/2021  PCP: Teena Dunk, NP   REFERRING PROVIDER: Newt Minion, MD  END OF SESSION:   PT End of Session - 12/11/21 1014     Visit Number 5    Number of Visits 9    Date for PT Re-Evaluation 01/08/22    Authorization Type [employer]    PT Start Time 1014    PT Stop Time 1101    PT Time Calculation (min) 47 min    Activity Tolerance Patient tolerated treatment well    Behavior During Therapy [facility] for tasks assessed/performed              History reviewed. No pertinent past medical history. History reviewed. No pertinent surgical history. There are no problems to display for this patient.   REFERRING DIAG: Acute pain of right knee [M25.561]   THERAPY DIAG:  Chronic pain of right knee  Muscle weakness (generalized)  Chronic pain of left knee  Other abnormalities of gait and mobility  Rationale for Evaluation and Treatment Rehabilitation  PERTINENT HISTORY: Chronic R knee pain   PRECAUTIONS:  None   SUBJECTIVE: "I've been doing my exercises and they have helped with walking, but still hurts with running and playing basketball or running. "  PAIN:  Are you having pain? Yes: NPRS scale: 0/10 at rest, playing basketball 8/10 Pain location: medial apsect of the knee Pain description: sharp Aggravating factors: Basketball/ running.  Relieving factors: N/A   OBJECTIVE: (objective measures completed at initial evaluation unless otherwise dated)   DIAGNOSTIC FINDINGS:  07/27/2021 MRI R knee IMPRESSION: 1. No meniscal or ligamentous injury of the right knee. 2. Mild edema in superolateral Hoffa's fat-pad as can be seen with patellar tendon-lateral femoral condyle friction syndrome.   PATIENT SURVEYS:  LEFS 52/80   COGNITION:           Overall cognitive status: Within functional limits for  tasks assessed                              POSTURE: rounded shoulders and forward head Genu Valgum bil    PALPATION: TTP along the medial joint line and around the pes anserine, and MCL    LOWER EXTREMITY ROM:   Active ROM Right eval Left eval  Hip flexion      Hip extension      Hip abduction      Hip adduction      Hip internal rotation      Hip external rotation      Knee flexion [facility]  Knee extension [facility]  Ankle dorsiflexion      Ankle plantarflexion      Ankle inversion      Ankle eversion       (Blank rows = not tested)   LOWER EXTREMITY MMT:   MMT Right eval Left eval 11/21/21  Hip flexion 4/5 4/5 4+/5 bilat  Hip extension 4/5 4/5   Hip abduction 3+/5 3+/5   Hip adduction       Hip internal rotation       Hip external rotation       Knee flexion 4/5 P! 4/5   Knee extension 4/5 4/5   Ankle dorsiflexion       Ankle plantarflexion       Ankle inversion  Ankle eversion        (Blank rows = not tested)   LOWER EXTREMITY SPECIAL TESTS:  Knee special tests: Valgus special test     GAIT: Distance walked: 100 ft (from waiting room to treatment area) Assistive device utilized: None Level of assistance: Complete Independence Comments: excessive R knee genu valgum, increased lateral heel whip and trendelenberg pattern when                                                 standing on the R LE     TODAY'S TREATMENT: [facility] Adult PT Treatment:                                                DATE: 12/11/2021 Therapeutic Exercise: Sidelying hip abduction 2 x 15 with 4#, progressed to CW/CCW circles 2 x 10  Forward step down with 6 inch step RLE 2 x 20 Sit to stand with BTB around knees with max cueing to avoid allowing valgum positon 2 x 20  Lunge with RLE advanced forward with medial pull of RLE with BTB 2 x 20 touching L knee down onto bosu Manual Therapy: MCL taping to reduce Genu Valgum Taping of arch to support posterior tib and reduce tibial IR/  genu valgum     [facility] Adult PT Treatment:                                                DATE: 11/28/21 Therapeutic Exercise: REc bike x 5 min 8 inch step opp knee drive 10 x 2 right  4 inch step down heel strikes R/L x 15 each STS from mat with green band x 10- mod cues for control Lateral band walks  with green band -  Rebonder toss red ball - increased pain on right when flexed more than 15 to 20 degrees  STS from mat  with blue band  TKE with ball behind knee at wall x 10 each 5 sec   Manual Therapy : Right knee Fat pad tape with KT - pt reports no change  with walking or SLS  [facility] Adult PT Treatment:                                                DATE: 11/21/21 Therapeutic Exercise: REc bike x 5 min 6 inch step opp knee drive 10 x 2 right  SLS 30  sec x 2 -cues to avoid hyperextension Single leg hip hinge with opp LE towel slide back 10 x 2 each  Rebonder toss red ball - increased pain on right  STS from mat  STS from mat with green band x 10- mod cues for control Monster walks with green band- cues for LE joint alignment Lateral band walks  with green band -  Side hip abduction green band 10 x 2     PATIENT EDUCATION:  Education details: evaluation findings, POC, goals, HEP with proper  form/ rationale Person educated: Patient Education method: Explanation, Demonstration, Verbal cues, and Handouts Education comprehension: verbalized understanding and verbal cues required     HOME EXERCISE PROGRAM: Access Code: [email or web address] URL: [email or web address] Date: 11/15/2021 Prepared by: Hessie Diener  Exercises - Sidelying Hip Abduction  - 1 x daily - 7 x weekly - 3 sets - 15 reps - Supine Bridge with Gluteal Set and Spinal Articulation  - 1 x daily - 7 x weekly - 2 sets - 10 reps - 5 seconds hold - Seated Hamstring Stretch  - 1 x daily - 7 x weekly - 2 sets - 2 reps - 30 hold - Supine Hamstring Stretch with Strap  - 1 x daily - 7 x weekly - 2 sets - 2 reps - 30  hold - Standing Hip Flexor Stretch  - 1 x daily - 7 x weekly - 2 sets - 2 reps - 30 hold - Standing Calf Raise With Small Ball at Heels  - 1 x daily - 7 x weekly - 2 sets - 10 reps - Gastroc Stretch on Wall  - 1 x daily - 7 x weekly - 3 sets - 10 reps - Clam with Resistance  - 1 x daily - 7 x weekly - 2 sets - 10 reps   ASSESSMENT:   CLINICAL IMPRESSION: Taylor Woods is making progress with physical therapy reporting improvement in pain with walking/ standing but continues to have pain with running and playing basketball. He continues to demonstrate an increased genu valgum especially with running, and noted increased pain along the MCL with valgus stress testing. Trialed taping the MCL and his arch to promote control both at the knee and distally. Continued working on [employer], and knee positoing with exercise and running. Plan to see pt for 2 more visits as he is unsure what is going to happen with his MCD in 1-2 months. He would benefit from physical therapy to decrease R knee pain, incrase strength, maximize proprioceptive awareness and maximize his function by addressing the deficits listed.      OBJECTIVE IMPAIRMENTS Abnormal gait, decreased activity tolerance, decreased endurance, decreased strength, and pain.    ACTIVITY LIMITATIONS standing and walking/ jogging   PARTICIPATION LIMITATIONS: shopping, occupation, and sports   PERSONAL FACTORS Time since onset of injury/illness/exacerbation are also affecting patient's functional outcome.    REHAB POTENTIAL: Good   CLINICAL DECISION MAKING: Stable/uncomplicated   EVALUATION COMPLEXITY: Low     GOALS: Goals reviewed with patient? Yes   SHORT TERM GOALS: Target date: 11/21/2021    Pt to be IND with initial HEP for therapeutic progression Baseline: no previous HEP Goal status: MET   2.  Pt to verbalize/ demo proper posture, lifting/ running mechanics to reduce and prevent bil knee pain Baseline: no previous knowledge of  proper posture Status 11/28/21: needs mod cues for hip hinge  Goal status: MET 12/11/2021     LONG TERM GOALS: Target date: 01/08/2022    Increase bil gross hip strength to promote knee stability and reduce progression of genu valgum. Baseline: see flow sheet Goal status: ONGOING 12/11/2021   2.  Pt to be able to walk >/= 30 min with </= 5/10 max pain for improvement in condition Baseline: via faces scale pt reported at worst 10/10 pain Goal status:  MET 12/11/2021   3.  Increase LEFS score to >/= 70 to demo improvement in function Baseline: 52/80 Goal status: ONGOING 12/11/2021   4.  Pt to be able to  perform interval training of walking/ running with max pain at </= 5/10 for pt's goals of returning to running  Baseline: worst pain is 10/10 Goal status: ONGOING 12/11/2021   5.  Pt to be IND with all HEP to be able to progress his current LOF IND Baseline: no previous HEP Goal status:  ONGOING 7/11/202 PLAN: PT FREQUENCY: 1-2x/week   PT DURATION: 6 weeks   PLANNED INTERVENTIONS: Therapeutic exercises, Therapeutic activity, Neuromuscular re-education, Balance training, Gait training, Patient/Family education, Joint manipulation, Joint mobilization, Stair training, Dry Needling, Cryotherapy, Moist heat, Taping, Manual therapy, and Re-evaluation   PLAN FOR NEXT SESSION: assess response to additions to HEP ;Review/ update HEP PRN, hip abductor strengthening, posterior tib activation, trial arch taping to reduce valgus knee positioning. **may require additional cueing / tactile cueing due to language barrier. Response to MCL and arch taping,     Sabriya Yono PT, DPT, LAT, ATC  12/11/21  11:16 AM       Check all possible CPT codes: 84033 - Re-evaluation, 53317- Therapeutic Exercise, [phone]- Neuro Re-education, [phone] - Gait Training, [phone] - Manual Therapy, 97530 - Therapeutic Activities, 97535 - Self Care, [phone] - Ultrasound, and 97750 - Physical performance training                                        If treatment provided at initial evaluation, no treatment charged due to lack of authorization.

## 2021-12-19 ENCOUNTER — Ambulatory Visit: Payer: Medicaid Other | Admitting: Physical Therapy

## 2021-12-26 ENCOUNTER — Encounter: Payer: Self-pay | Admitting: Physical Therapy

## 2021-12-26 ENCOUNTER — Ambulatory Visit: Payer: Medicaid Other | Admitting: Physical Therapy

## 2021-12-26 DIAGNOSIS — M25561 Pain in right knee: Secondary | ICD-10-CM | POA: Diagnosis not present

## 2021-12-26 DIAGNOSIS — M6281 Muscle weakness (generalized): Secondary | ICD-10-CM

## 2021-12-26 DIAGNOSIS — G8929 Other chronic pain: Secondary | ICD-10-CM

## 2021-12-26 NOTE — Therapy (Addendum)
OUTPATIENT PHYSICAL THERAPY TREATMENT NOTE / Discharge   Patient Name: Taylor Woods MRN: [id number] DOB:May 28, 2001, 21 y.o., male 22 Date: 12/26/2021  PCP: Teena Dunk, NP   REFERRING PROVIDER: Newt Minion, MD  END OF SESSION:   PT End of Session - 12/26/21 1156     Visit Number 6    Number of Visits 9    Date for PT Re-Evaluation 01/08/22    Authorization Type [employer]    Authorization Time Period 10/31/21-11/29/21; 12/11/21-02/08/22    Authorization - Visit Number 2    Authorization - Number of Visits 5    PT Start Time 7371    PT Stop Time 1235    PT Time Calculation (min) 40 min              History reviewed. No pertinent past medical history. History reviewed. No pertinent surgical history. There are no problems to display for this patient.   REFERRING DIAG: Acute pain of right knee [M25.561]   THERAPY DIAG:  Chronic pain of right knee  Muscle weakness (generalized)  Chronic pain of left knee  Rationale for Evaluation and Treatment Rehabilitation  PERTINENT HISTORY: Chronic R knee pain   PRECAUTIONS:  None   SUBJECTIVE: "I've been doing my exercises and they have helped with walking, but still hurts with running and playing basketball or running. "  PAIN:  Are you having pain? Yes: NPRS scale: 0/10 at rest, playing basketball 8/10 Pain location: medial apsect of the knee Pain description: sharp Aggravating factors: Basketball/ running.  Relieving factors: N/A   OBJECTIVE: (objective measures completed at initial evaluation unless otherwise dated)   DIAGNOSTIC FINDINGS:  07/27/2021 MRI R knee IMPRESSION: 1. No meniscal or ligamentous injury of the right knee. 2. Mild edema in superolateral Hoffa's fat-pad as can be seen with patellar tendon-lateral femoral condyle friction syndrome.   PATIENT SURVEYS:  LEFS 52/80 LEFS 35/80 12/26/21   COGNITION:           Overall cognitive status: Within functional limits for tasks  assessed                              POSTURE: rounded shoulders and forward head Genu Valgum bil    PALPATION: TTP along the medial joint line and around the pes anserine, and MCL    LOWER EXTREMITY ROM:   Active ROM Right eval Left eval  Hip flexion      Hip extension      Hip abduction      Hip adduction      Hip internal rotation      Hip external rotation      Knee flexion [facility]  Knee extension  [facility]  Ankle dorsiflexion      Ankle plantarflexion      Ankle inversion      Ankle eversion       (Blank rows = not tested)   LOWER EXTREMITY MMT:   MMT Right eval Left eval 11/21/21 12/26/21  Hip flexion 4/5 4/5 4+/5 bilat   Hip extension 4/5 4/5    Hip abduction 3+/5 3+/5  4/5 bilat  Hip adduction        Hip internal rotation        Hip external rotation        Knee flexion 4/5 P! 4/5    Knee extension 4/5 4/5    Ankle dorsiflexion  Ankle plantarflexion        Ankle inversion        Ankle eversion         (Blank rows = not tested)   LOWER EXTREMITY SPECIAL TESTS:  Knee special tests: Valgus special test     GAIT: Distance walked: 100 ft (from waiting room to treatment area) Assistive device utilized: None Level of assistance: Complete Independence Comments: excessive R knee genu valgum, increased lateral heel whip and trendelenberg pattern when                                                 standing on the R LE     TODAY'S TREATMENT: [facility] Adult PT Treatment:                                                DATE: 12/26/21 Therapeutic Exercise: Elliptical L3 x 5 minutes  Leg Press 60# x 15, 20# 10 x 2 each  8 inch step opp knee drive 10 x 2 right , left  6 inch step down heel strikes R/L x 15 each STS from mat with green band x 10- mod cues for control Lateral band walks  with blue band - monster walks   [facility] Adult PT Treatment:                                                DATE: 12/11/2021 Therapeutic Exercise: Sidelying hip abduction 2 x 15  with 4#, progressed to CW/CCW circles 2 x 10  Forward step down with 6 inch step RLE 2 x 20 Sit to stand with BTB around knees with max cueing to avoid allowing valgum positon 2 x 20  Lunge with RLE advanced forward with medial pull of RLE with BTB 2 x 20 touching L knee down onto bosu Manual Therapy: MCL taping to reduce Genu Valgum Taping of arch to support posterior tib and reduce tibial IR/ genu valgum     [facility] Adult PT Treatment:                                                DATE: 11/28/21 Therapeutic Exercise: REc bike x 5 min 8 inch step opp knee drive 10 x 2 right  4 inch step down heel strikes R/L x 15 each STS from mat with green band x 10- mod cues for control Lateral band walks  with green band -  Rebonder toss red ball - increased pain on right when flexed more than 15 to 20 degrees  STS from mat  with blue band  TKE with ball behind knee at wall x 10 each 5 sec   Manual Therapy : Right knee Fat pad tape with KT - pt reports no change  with walking or SLS  [facility] Adult PT Treatment:  DATE: 11/21/21 Therapeutic Exercise: REc bike x 5 min 6 inch step opp knee drive 10 x 2 right  SLS 30  sec x 2 -cues to avoid hyperextension Single leg hip hinge with opp LE towel slide back 10 x 2 each  Rebonder toss red ball - increased pain on right  STS from mat  STS from mat with green band x 10- mod cues for control Monster walks with green band- cues for LE joint alignment Lateral band walks  with green band -  Side hip abduction green band 10 x 2     PATIENT EDUCATION:  Education details: evaluation findings, POC, goals, HEP with proper form/ rationale Person educated: Patient Education method: Explanation, Demonstration, Verbal cues, and Handouts Education comprehension: verbalized understanding and verbal cues required     HOME EXERCISE PROGRAM: Access Code: [email or web address] URL: [email or web address] Date:  11/15/2021 Prepared by: Hessie Diener  Exercises - Sidelying Hip Abduction  - 1 x daily - 7 x weekly - 3 sets - 15 reps - Supine Bridge with Gluteal Set and Spinal Articulation  - 1 x daily - 7 x weekly - 2 sets - 10 reps - 5 seconds hold - Seated Hamstring Stretch  - 1 x daily - 7 x weekly - 2 sets - 2 reps - 30 hold - Supine Hamstring Stretch with Strap  - 1 x daily - 7 x weekly - 2 sets - 2 reps - 30 hold - Standing Hip Flexor Stretch  - 1 x daily - 7 x weekly - 2 sets - 2 reps - 30 hold - Standing Calf Raise With Small Ball at Heels  - 1 x daily - 7 x weekly - 2 sets - 10 reps - Gastroc Stretch on Wall  - 1 x daily - 7 x weekly - 3 sets - 10 reps - Clam with Resistance  - 1 x daily - 7 x weekly - 2 sets - 10 reps   ASSESSMENT:   CLINICAL IMPRESSION: Avantae reports no improvement with tape last session. His L knee pain has resolved however he reports no improvement in Right knee pain. He reports continued pain with walking and is unable to run or play basketball. Interpreter service via Tablet was offered today and he declined. LEFS score decreased and could be due to a language barrier. Focused on strengthening today. He demonstrates improved hip strength, bilaterally. Continued RLE Valgus knee position. We discussed continued strengthening for the remainder of his POC however he opted to discharge from formal PT due to lack of progress. At this point, he is appropriate to return to MD for f/u and discuss other treatment options for his right knee. He may benefit from an unloader brace for hie right knee. He met 3 out of 5  of his LTG goals.      OBJECTIVE IMPAIRMENTS Abnormal gait, decreased activity tolerance, decreased endurance, decreased strength, and pain.    ACTIVITY LIMITATIONS standing and walking/ jogging   PARTICIPATION LIMITATIONS: shopping, occupation, and sports   PERSONAL FACTORS Time since onset of injury/illness/exacerbation are also affecting patient's functional  outcome.    REHAB POTENTIAL: Good   CLINICAL DECISION MAKING: Stable/uncomplicated   EVALUATION COMPLEXITY: Low     GOALS: Goals reviewed with patient? Yes   SHORT TERM GOALS: Target date: 11/21/2021    Pt to be IND with initial HEP for therapeutic progression Baseline: no previous HEP Goal status: MET   2.  Pt to verbalize/ demo proper posture, lifting/ running mechanics  to reduce and prevent bil knee pain Baseline: no previous knowledge of proper posture Status 11/28/21: needs mod cues for hip hinge  Goal status: MET 12/11/2021     LONG TERM GOALS: Target date: 01/08/2022    Increase bil gross hip strength to promote knee stability and reduce progression of genu valgum. Baseline: see flow sheet Goal status: MET 12/26/2021   2.  Pt to be able to walk >/= 30 min with </= 5/10 max pain for improvement in condition Baseline: via faces scale pt reported at worst 10/10 pain Goal status:  MET 12/11/2021   3.  Increase LEFS score to >/= 70 to demo improvement in function Baseline: 52/80 Goal status: NOT MET 12/26/2021   4.  Pt to be able to perform interval training of walking/ running with max pain at </= 5/10 for pt's goals of returning to running  Baseline: worst pain is 10/10 Status: 12/23/21 limited by pain Goal status: NOT MET 12/26/2021   5.  Pt to be IND with all HEP to be able to progress his current LOF IND Baseline: no previous HEP Goal status:  MET 7/26/202 PLAN: PT FREQUENCY: 1-2x/week   PT DURATION: 6 weeks   PLANNED INTERVENTIONS: Therapeutic exercises, Therapeutic activity, Neuromuscular re-education, Balance training, Gait training, Patient/Family education, Joint manipulation, Joint mobilization, Stair training, Dry Needling, Cryotherapy, Moist heat, Taping, Manual therapy, and Re-evaluation   PLAN FOR NEXT SESSION: DC to HEP today. Return to MD for F/U     Hessie Diener, PTA 12/26/21 12:43 PM Phone: [phone] Fax: [phone]         PHYSICAL  THERAPY DISCHARGE SUMMARY  Visits from Start of Care: 6  Current functional level related to goals / functional outcomes: See goals   Remaining deficits: See assessment   Education / Equipment: HEP, theraband, posture, taping techniques,    Patient agrees to discharge. Patient goals were partially met. Patient is being discharged due to lack of progress.   Kristoffer Leamon PT, DPT, LAT, ATC  12/26/21  2:55 PM

## 2022-01-08 ENCOUNTER — Ambulatory Visit (INDEPENDENT_AMBULATORY_CARE_PROVIDER_SITE_OTHER): Payer: Medicaid Other | Admitting: Family

## 2022-01-08 DIAGNOSIS — Z8739 Personal history of other diseases of the musculoskeletal system and connective tissue: Secondary | ICD-10-CM

## 2022-01-09 ENCOUNTER — Encounter: Payer: Self-pay | Admitting: Family

## 2022-01-09 NOTE — Progress Notes (Signed)
Office Visit Note   Patient: Taylor Woods           Date of Birth: 2000-12-20           MRN: [id number] Visit Date: 01/08/2022              Requested by: [name], NP No address on file PCP: Kathrynn Speed, NP  Chief Complaint  Patient presents with   Right Knee - Follow-up      HPI: The patient is a 21 year old gentleman who presents in follow-up for chronic right knee pain.  Patellofemoral pain especially laterally.  The patient has a history of genu valgum.  Has been in physical therapy for the last 3 months working on strengthening and gait training he reports no improvement in his pain.  Review of the MRI scan shows no meniscal or ligamentous injury there is some edema laterally consistent with lateral impingement in the patellofemoral joint. Assessment & Plan: Visit Diagnoses: No diagnosis found.  Plan: Discussed case with Dr. Roda Shutters.  He has agreed to see patient for surgical management options to patient.  Patient is in agreement with the plan will follow-up with Dr. Roda Shutters  Follow-Up Instructions: Return c XU.   Right Knee Exam   Tenderness  The patient is experiencing tenderness in the lateral joint line.  Range of Motion  The patient has normal right knee ROM.  Tests  Varus: negative Valgus: negative  Comments:  Valgus alignment      Patient is alert, oriented, no adenopathy, well-dressed, normal affect, normal respiratory effort.   Imaging: No results found.   Labs: Lab Results  Component Value Date   HGBA1C 5.7 (H) 02/19/2021     Lab Results  Component Value Date   ALBUMIN 4.6 02/19/2021    No results found for: "MG" Lab Results  Component Value Date   VD25OH 24 (L) 03/01/2021    No results found for: "PREALBUMIN"    Latest Ref Rng & Units 03/01/2021   10:08 AM 02/19/2021   10:30 AM  CBC EXTENDED  WBC 3.8 - 10.8 Thousand/uL 3.9  4.6   RBC 4.20 - 5.80 Million/uL 5.53  5.49   Hemoglobin 13.2 - 17.1 g/dL 23.5  57.3   HCT  22.0 - 50.0 % 45.4  45.0   Platelets 140 - 400 Thousand/uL 238  243   NEUT# 1,500 - 7,800 cells/uL 1,166  1.2   Lymph# 850 - 3,900 cells/uL 2,005  2.4      There is no height or weight on file to calculate BMI.  Orders:  No orders of the defined types were placed in this encounter.  No orders of the defined types were placed in this encounter.    Procedures: No procedures performed  Clinical Data: No additional findings.  ROS:  All other systems negative, except as noted in the HPI. Review of Systems  Objective: Vital Signs: There were no vitals taken for this visit.  Specialty Comments:  No specialty comments available.  PMFS History: There are no problems to display for this patient.  History reviewed. No pertinent past medical history.  History reviewed. No pertinent family history.  History reviewed. No pertinent surgical history. Social History   Occupational History   Not on file  Tobacco Use   Smoking status: Never   Smokeless tobacco: Never  Substance and Sexual Activity   Alcohol use: Never   Drug use: Never   Sexual activity: Not Currently

## 2022-01-11 ENCOUNTER — Ambulatory Visit (INDEPENDENT_AMBULATORY_CARE_PROVIDER_SITE_OTHER): Payer: Medicaid Other | Admitting: Orthopaedic Surgery

## 2022-01-11 ENCOUNTER — Encounter: Payer: Self-pay | Admitting: Orthopaedic Surgery

## 2022-01-11 DIAGNOSIS — M25561 Pain in right knee: Secondary | ICD-10-CM | POA: Diagnosis not present

## 2022-01-11 NOTE — Progress Notes (Signed)
   Office Visit Note   Patient: Taylor Woods           Date of Birth: 07/15/2000           MRN: [id number] Visit Date: 01/11/2022              Requested by: [name], NP No address on file PCP: Kathrynn Speed, NP   Assessment & Plan: Visit Diagnoses:  1. 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  No structural abnormalities cyst seen on MRI.  Patient may be a candidate for corrective osteotomy.  We will refer to Dr. Steward Drone for further evaluation and discussion of treatment options.  Follow-Up Instructions: No follow-ups on file.   Orders:  Orders Placed This Encounter  Procedures   Ambulatory referral to Orthopedic Surgery   No orders of the defined types were placed in this encounter.     Procedures: No procedures performed   Clinical Data: No additional findings.   Subjective: Chief Complaint  Patient presents with   Right Knee - Pain    HPI Patient is a 21 year old male referral from [place] for chronic right knee pain.  Has undergone extensive physical therapy for this problem.  He has medial sided right knee pain.  Denies any injuries.  Denies any mechanical symptoms.  Denies any swelling.  Has not had any injections.  Had an MRI about 6 months ago which was consistent with patella maltracking.  Review of Systems  Constitutional: Negative.   All other systems reviewed and are negative.    Objective: Vital Signs: There were no vitals taken for this visit.  Physical Exam Vitals and nursing note reviewed.  Constitutional:      Appearance: He is well-developed.  HENT:     Head: Normocephalic and atraumatic.  Eyes:     Pupils: Pupils are equal, round, and reactive to light.  Pulmonary:     Effort: Pulmonary effort is normal.  Abdominal:     Palpations: Abdomen is soft.  Musculoskeletal:        General: Normal range of motion.     Cervical back: Neck supple.  Skin:     General: Skin is warm.  Neurological:     Mental Status: He is alert and oriented to person, place, and time.  Psychiatric:        Behavior: Behavior normal.        Thought Content: Thought content normal.        Judgment: Judgment normal.     Ortho Exam Examination of the right knee shows increased Q angle and valgus alignment.  Medial joint line tenderness.  Collaterals and cruciates are stable. Specialty Comments:  No specialty comments available.  Imaging: No results found.   PMFS History: There are no problems to display for this patient.  History reviewed. No pertinent past medical history.  History reviewed. No pertinent family history.  History reviewed. No pertinent surgical history. Social History   Occupational History   Not on file  Tobacco Use   Smoking status: Never   Smokeless tobacco: Never  Substance and Sexual Activity   Alcohol use: Never   Drug use: Never   Sexual activity: Not Currently

## 2022-01-17 ENCOUNTER — Ambulatory Visit (INDEPENDENT_AMBULATORY_CARE_PROVIDER_SITE_OTHER): Payer: Medicaid Other | Admitting: Orthopaedic Surgery

## 2022-01-17 ENCOUNTER — Ambulatory Visit (INDEPENDENT_AMBULATORY_CARE_PROVIDER_SITE_OTHER): Payer: Medicaid Other

## 2022-01-17 DIAGNOSIS — M25561 Pain in right knee: Secondary | ICD-10-CM

## 2022-01-17 DIAGNOSIS — G8929 Other chronic pain: Secondary | ICD-10-CM | POA: Diagnosis not present

## 2022-01-17 DIAGNOSIS — Z8739 Personal history of other diseases of the musculoskeletal system and connective tissue: Secondary | ICD-10-CM

## 2022-01-17 NOTE — Progress Notes (Signed)
Chief Complaint: Bilateral knee pain     History of Present Illness:    Taylor Woods is a 21 y.o. male presents today with bilateral knee pain in the setting of valgus knee deformity.  He is here today as he is hoping to play basketball professionally but believes that his knees are significantly limiting this.  He has tenderness about the pes tendon medially.  He is here today for further assessment.  He is here as a referral from Dr. Roda Shutters    Surgical History:   None  PMH/PSH/Family History/Social History/Meds/Allergies:   No past medical history on file. No past surgical history on file. Social History   Socioeconomic History   Marital status: Single    Spouse name: Not on file   Number of children: Not on file   Years of education: Not on file   Highest education level: Not on file  Occupational History   Not on file  Tobacco Use   Smoking status: Never   Smokeless tobacco: Never  Substance and Sexual Activity   Alcohol use: Never   Drug use: Never   Sexual activity: Not Currently  Other Topics Concern   Not on file  Social History Narrative   Not on file   Social Determinants of Health   Financial Resource Strain: Not on file  Food Insecurity: Not on file  Transportation Needs: Not on file  Physical Activity: Not on file  Stress: Not on file  Social Connections: Not on file   No family history on file. No Known Allergies No current outpatient medications on file.   No current facility-administered medications for this visit.   No results found.  Review of Systems:   A ROS was performed including pertinent positives and negatives as documented in the HPI.  Physical Exam :   Constitutional: NAD and appears stated age Neurological: Alert and oriented Psych: Appropriate affect and cooperative There were no vitals taken for this visit.   Comprehensive Musculoskeletal Exam:    Significant valgus to the deformity over  bilateral knees.  There is tenderness over bilateral pes tendons medially.  No joint line tenderness.  Negative Lachman, negative posterior drawer, no varus or valgus laxity  Imaging:   Xray (limb length views bilaterally): Significant valgus deformity bilaterally with open physes   I personally reviewed and interpreted the radiographs.   Assessment:   21 y.o. 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  I did discuss that given the fact that he is 20 and still growing this is somewhat of an unusual phenomenon.  Nevertheless he does have evidence of open physes and to that effect I do technically believe that he may be a candidate for guided growth.  He is significantly concerned about his valgus deformity and his ability to play basketball.  That effect I would like to initially refer him to [facility] pediatric orthopedics to see if he would be a candidate for guided growth at this is a much more benign procedure as opposed to an osteotomy.  Plan :    -Plan for referral to [facility] for discussion of possible guided growth for significant valgus deformity     I personally saw and evaluated the patient, and participated in the management and treatment plan.  Huel Cote, MD  Attending Physician, Orthopedic Surgery  This document was dictated using Dragon voice recognition software. A reasonable attempt at proof reading has been made to minimize errors.

## 2023-05-26 IMAGING — MR MR KNEE*R* W/O CM
4 of 6 series · 23 of 40 positions shown · non-contrast
Comparison: None.

CLINICAL DATA: Chronic knee pain.  Anterior pain for 4 years.

EXAM:
MRI OF THE RIGHT KNEE WITHOUT CONTRAST
TECHNIQUE: Multiplanar, multisequence MR imaging of the knee was performed. No
intravenous contrast was administered.

[Series 4: T1 · coronal · 4.0mm · 0.35mm/px · 3 of 25 slices shown]
[im 5/25]
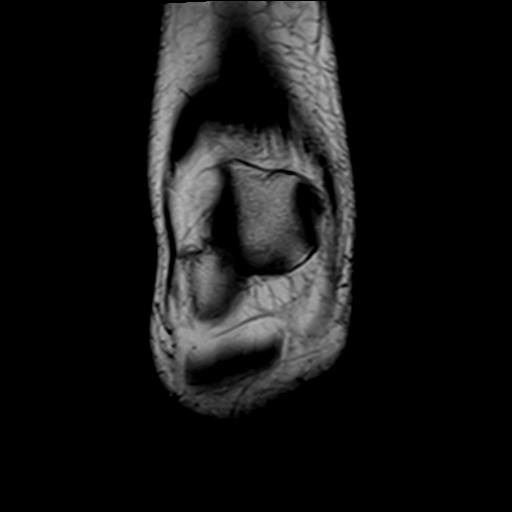
[im 15/25]
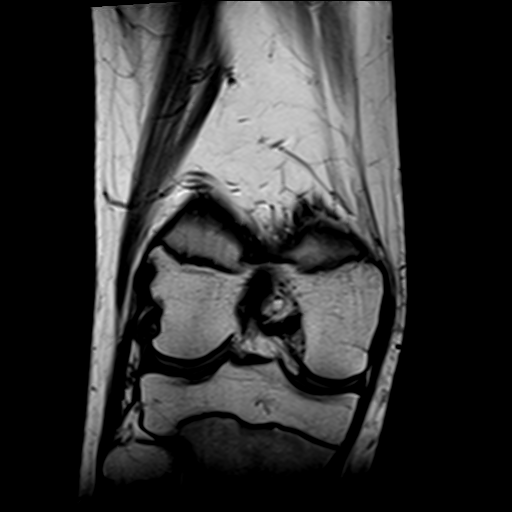
[im 25/25]
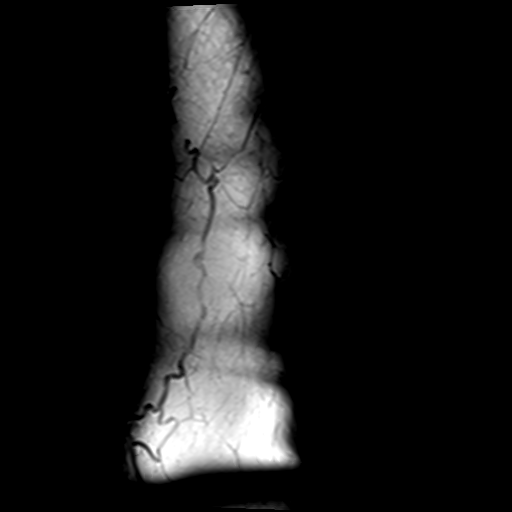

[Series 6: T2 fat-sat · coronal · 4.0mm · 0.70mm/px · 6 of 25 slices shown (1 of 2)]
[im 1/25]
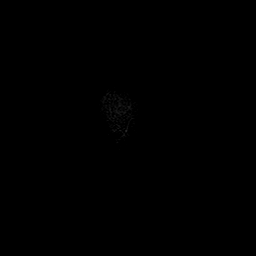
[im 5/25]
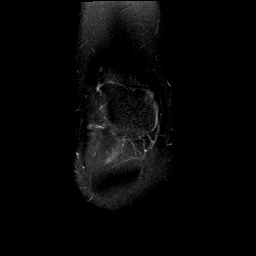
[im 10/25]
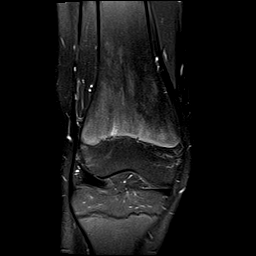
[im 15/25]
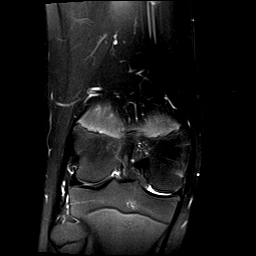
[im 20/25]
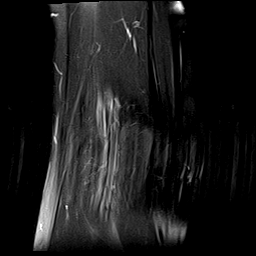
[im 25/25]
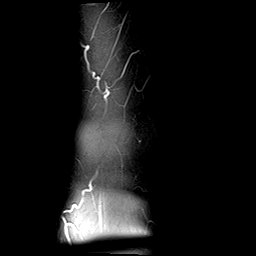

[Series 7: PD fat-sat · sagittal · 3.0mm · 0.35mm/px · 7 of 32 slices shown]
[im 1/32]
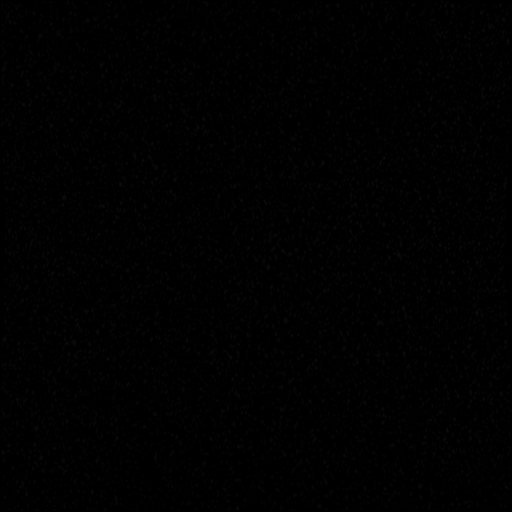
[im 6/32]
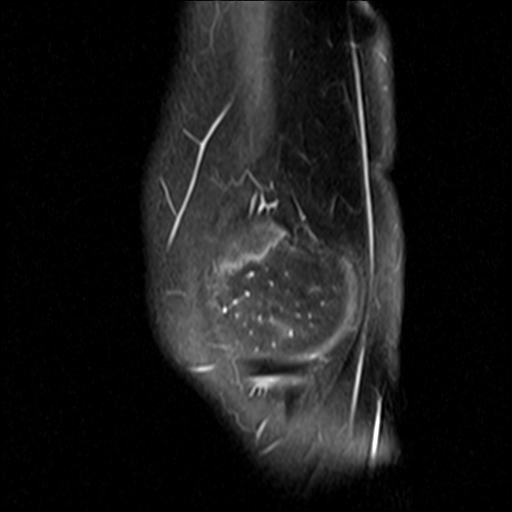
[im 11/32]
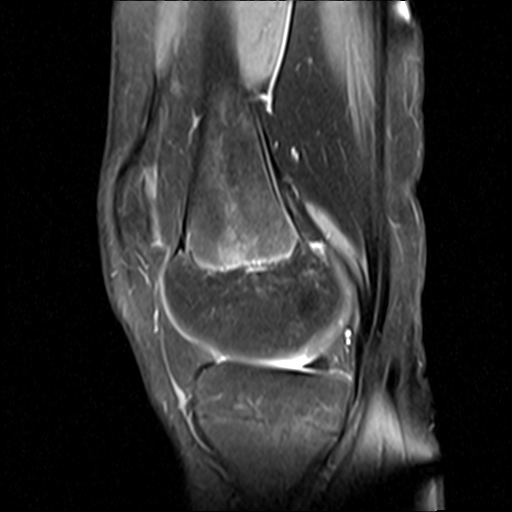
[im 16/32]
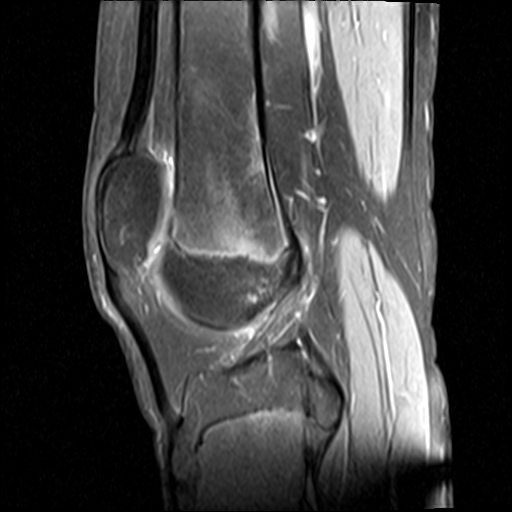
[im 21/32]
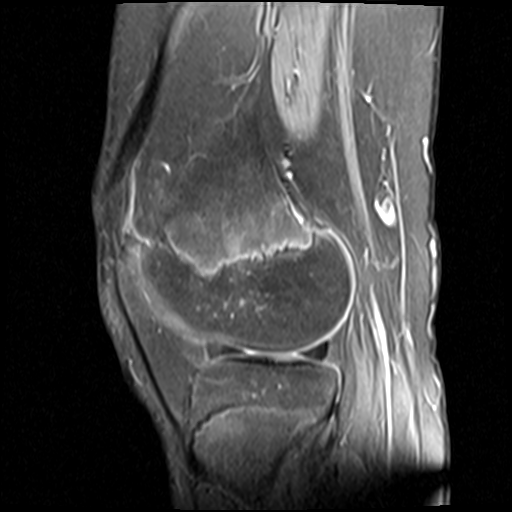
[im 26/32]
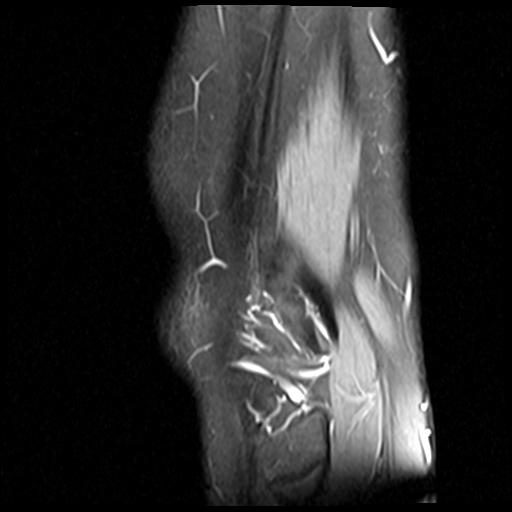
[im 32/32]
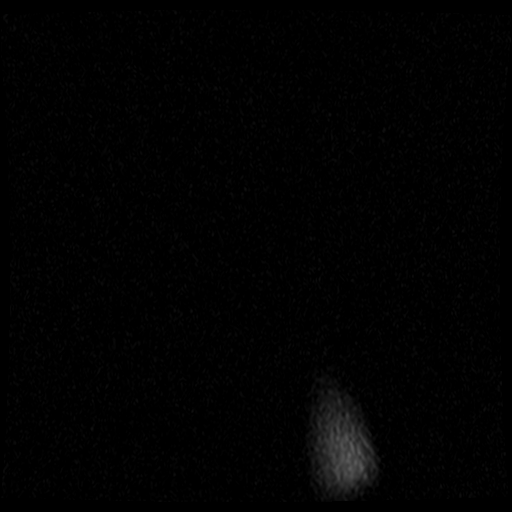

[Series 8: T2 fat-sat · sagittal · 3.0mm · 0.35mm/px · 7 of 32 slices shown (2 of 2)]
[im 1/32]
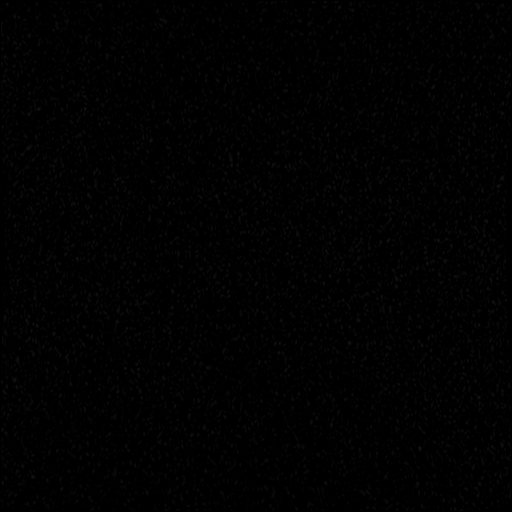
[im 6/32]
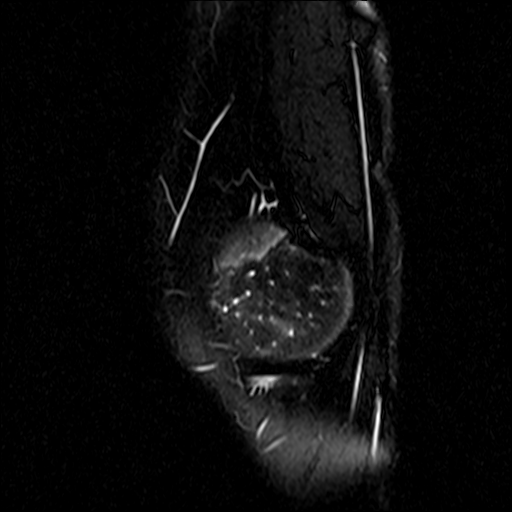
[im 11/32]
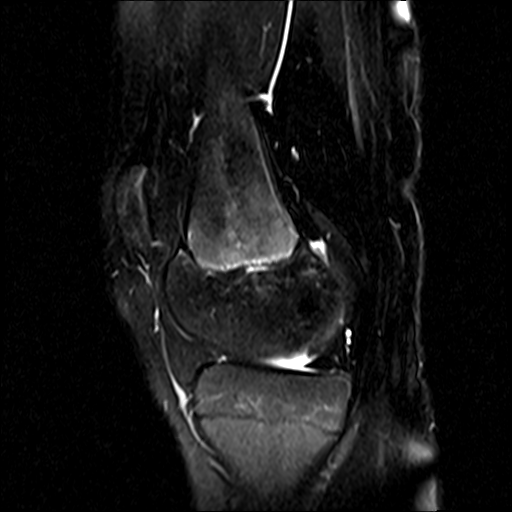
[im 16/32]
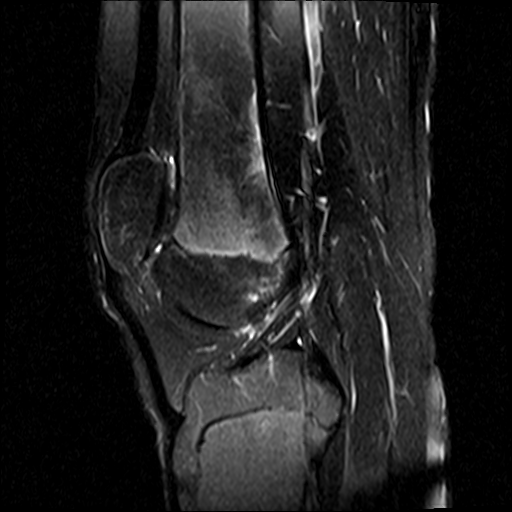
[im 21/32]
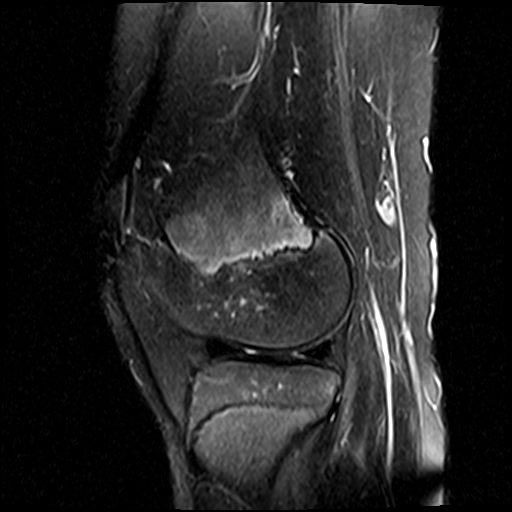
[im 26/32]
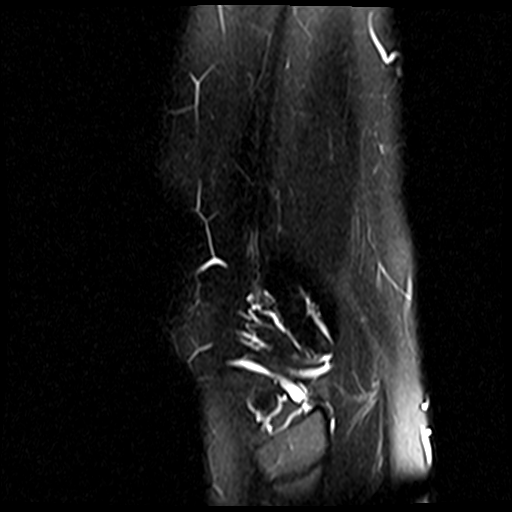
[im 32/32]
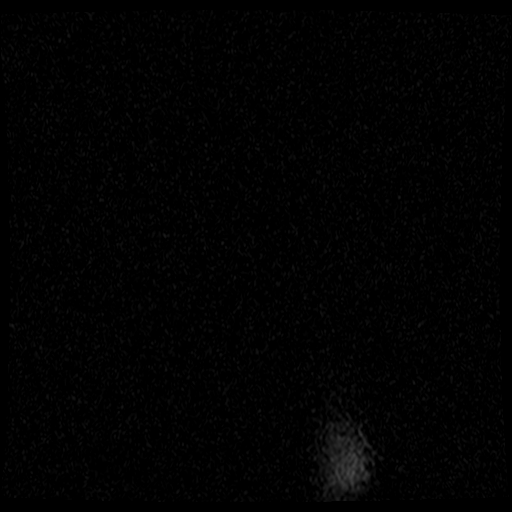

[23 of 40 positions shown; findings below may reference images not displayed]

FINDINGS: MENISCI

Medial: Intact.

Lateral: Intact.

LIGAMENTS

Cruciates: ACL and PCL are intact.

Collaterals: Medial collateral ligament is intact. Lateral
collateral ligament complex is intact.

CARTILAGE

Patellofemoral:  No chondral defect.

Medial:  No chondral defect.

Lateral:  No chondral defect.

JOINT: No joint effusion. Mild edema in superolateral [employer]
Kaneshka. No plical thickening.

POPLITEAL FOSSA: Popliteus tendon is intact. No Baker's cyst.

EXTENSOR MECHANISM: Intact quadriceps tendon. Intact patellar
tendon. Intact lateral patellar retinaculum. Intact medial patellar
retinaculum. Intact MPFL. TT-TG distance 10 mm.

BONES: No aggressive osseous lesion. No fracture or dislocation.

Other: No fluid collection or hematoma. Muscles are normal.
IMPRESSION: 1. No meniscal or ligamentous injury of the right knee.
2. Mild edema in superolateral Nory Luongo as can be seen with
patellar tendon-lateral femoral condyle friction syndrome.
